# Patient Record
Sex: Female | Born: 1935 | Race: White | Hispanic: No | Marital: Married | Smoking: Never smoker
Health system: Southern US, Community
[De-identification: ages and names within clinical notes are randomized; demographics above are authoritative.]

## PROBLEM LIST (undated history)

## (undated) DIAGNOSIS — M545 Low back pain, unspecified: Secondary | ICD-10-CM

## (undated) DIAGNOSIS — G8929 Other chronic pain: Secondary | ICD-10-CM

## (undated) DIAGNOSIS — I10 Essential (primary) hypertension: Secondary | ICD-10-CM

## (undated) DIAGNOSIS — E559 Vitamin D deficiency, unspecified: Secondary | ICD-10-CM

## (undated) DIAGNOSIS — H9190 Unspecified hearing loss, unspecified ear: Secondary | ICD-10-CM

## (undated) HISTORY — PX: APPENDECTOMY: SHX54

## (undated) HISTORY — PX: CHOLECYSTECTOMY: SHX55

## (undated) HISTORY — PX: ABDOMINAL HYSTERECTOMY: SHX81

## (undated) HISTORY — DX: Essential (primary) hypertension: I10

---

## 1999-09-15 ENCOUNTER — Other Ambulatory Visit: Admission: RE | Admit: 1999-09-15 | Discharge: 1999-09-15 | Payer: Self-pay | Admitting: Emergency Medicine

## 1999-11-09 ENCOUNTER — Ambulatory Visit (HOSPITAL_COMMUNITY): Admission: RE | Admit: 1999-11-09 | Discharge: 1999-11-09 | Payer: Self-pay | Admitting: *Deleted

## 2000-04-08 ENCOUNTER — Encounter: Admission: RE | Admit: 2000-04-08 | Discharge: 2000-04-08 | Payer: Self-pay | Admitting: Emergency Medicine

## 2000-04-08 ENCOUNTER — Encounter: Payer: Self-pay | Admitting: Emergency Medicine

## 2000-09-17 ENCOUNTER — Other Ambulatory Visit: Admission: RE | Admit: 2000-09-17 | Discharge: 2000-09-17 | Payer: Self-pay | Admitting: Emergency Medicine

## 2001-04-11 ENCOUNTER — Encounter: Admission: RE | Admit: 2001-04-11 | Discharge: 2001-04-11 | Payer: Self-pay | Admitting: Emergency Medicine

## 2001-04-11 ENCOUNTER — Encounter: Payer: Self-pay | Admitting: Emergency Medicine

## 2002-04-13 ENCOUNTER — Encounter: Payer: Self-pay | Admitting: Emergency Medicine

## 2002-04-13 ENCOUNTER — Encounter: Admission: RE | Admit: 2002-04-13 | Discharge: 2002-04-13 | Payer: Self-pay | Admitting: Emergency Medicine

## 2003-04-16 ENCOUNTER — Encounter: Admission: RE | Admit: 2003-04-16 | Discharge: 2003-04-16 | Payer: Self-pay | Admitting: Emergency Medicine

## 2003-04-16 ENCOUNTER — Encounter: Payer: Self-pay | Admitting: Emergency Medicine

## 2004-05-18 ENCOUNTER — Ambulatory Visit (HOSPITAL_COMMUNITY): Admission: RE | Admit: 2004-05-18 | Discharge: 2004-05-18 | Payer: Self-pay | Admitting: Emergency Medicine

## 2005-01-11 ENCOUNTER — Encounter (INDEPENDENT_AMBULATORY_CARE_PROVIDER_SITE_OTHER): Payer: Self-pay | Admitting: *Deleted

## 2005-01-11 ENCOUNTER — Ambulatory Visit (HOSPITAL_COMMUNITY): Admission: RE | Admit: 2005-01-11 | Discharge: 2005-01-11 | Payer: Self-pay | Admitting: *Deleted

## 2005-07-02 ENCOUNTER — Ambulatory Visit (HOSPITAL_COMMUNITY): Admission: RE | Admit: 2005-07-02 | Discharge: 2005-07-02 | Payer: Self-pay | Admitting: Emergency Medicine

## 2006-07-03 ENCOUNTER — Ambulatory Visit (HOSPITAL_COMMUNITY): Admission: RE | Admit: 2006-07-03 | Discharge: 2006-07-03 | Payer: Self-pay | Admitting: Emergency Medicine

## 2007-07-07 ENCOUNTER — Ambulatory Visit (HOSPITAL_COMMUNITY): Admission: RE | Admit: 2007-07-07 | Discharge: 2007-07-07 | Payer: Self-pay | Admitting: Emergency Medicine

## 2008-07-07 ENCOUNTER — Ambulatory Visit (HOSPITAL_COMMUNITY): Admission: RE | Admit: 2008-07-07 | Discharge: 2008-07-07 | Payer: Self-pay | Admitting: Emergency Medicine

## 2009-07-13 ENCOUNTER — Ambulatory Visit (HOSPITAL_COMMUNITY): Admission: RE | Admit: 2009-07-13 | Discharge: 2009-07-13 | Payer: Self-pay | Admitting: Emergency Medicine

## 2010-07-17 ENCOUNTER — Ambulatory Visit (HOSPITAL_COMMUNITY): Admission: RE | Admit: 2010-07-17 | Discharge: 2010-07-17 | Payer: Self-pay | Admitting: Family Medicine

## 2011-07-02 ENCOUNTER — Other Ambulatory Visit (HOSPITAL_COMMUNITY): Payer: Self-pay | Admitting: Family Medicine

## 2011-07-02 DIAGNOSIS — Z1231 Encounter for screening mammogram for malignant neoplasm of breast: Secondary | ICD-10-CM

## 2011-07-19 ENCOUNTER — Ambulatory Visit (HOSPITAL_COMMUNITY)
Admission: RE | Admit: 2011-07-19 | Discharge: 2011-07-19 | Disposition: A | Discharge status: Home / Self Care | Payer: Medicare Other | Source: Ambulatory Visit | Attending: Family Medicine | Admitting: Family Medicine

## 2011-07-19 DIAGNOSIS — Z1231 Encounter for screening mammogram for malignant neoplasm of breast: Secondary | ICD-10-CM | POA: Insufficient documentation

## 2011-10-10 DIAGNOSIS — H26499 Other secondary cataract, unspecified eye: Secondary | ICD-10-CM | POA: Diagnosis not present

## 2011-10-10 DIAGNOSIS — H353190 Nonexudative age-related macular degeneration, unspecified eye, stage unspecified: Secondary | ICD-10-CM | POA: Diagnosis not present

## 2011-10-10 DIAGNOSIS — H35349 Macular cyst, hole, or pseudohole, unspecified eye: Secondary | ICD-10-CM | POA: Diagnosis not present

## 2011-10-10 DIAGNOSIS — H43819 Vitreous degeneration, unspecified eye: Secondary | ICD-10-CM | POA: Diagnosis not present

## 2011-11-14 DIAGNOSIS — H43829 Vitreomacular adhesion, unspecified eye: Secondary | ICD-10-CM | POA: Diagnosis not present

## 2011-11-14 DIAGNOSIS — H353190 Nonexudative age-related macular degeneration, unspecified eye, stage unspecified: Secondary | ICD-10-CM | POA: Diagnosis not present

## 2011-11-14 DIAGNOSIS — H35429 Microcystoid degeneration of retina, unspecified eye: Secondary | ICD-10-CM | POA: Diagnosis not present

## 2011-11-14 DIAGNOSIS — H43819 Vitreous degeneration, unspecified eye: Secondary | ICD-10-CM | POA: Diagnosis not present

## 2012-01-16 DIAGNOSIS — H35429 Microcystoid degeneration of retina, unspecified eye: Secondary | ICD-10-CM | POA: Diagnosis not present

## 2012-01-16 DIAGNOSIS — H3581 Retinal edema: Secondary | ICD-10-CM | POA: Diagnosis not present

## 2012-01-16 DIAGNOSIS — H353190 Nonexudative age-related macular degeneration, unspecified eye, stage unspecified: Secondary | ICD-10-CM | POA: Diagnosis not present

## 2012-01-16 DIAGNOSIS — H43829 Vitreomacular adhesion, unspecified eye: Secondary | ICD-10-CM | POA: Diagnosis not present

## 2012-02-21 DIAGNOSIS — Z1331 Encounter for screening for depression: Secondary | ICD-10-CM | POA: Diagnosis not present

## 2012-02-21 DIAGNOSIS — L219 Seborrheic dermatitis, unspecified: Secondary | ICD-10-CM | POA: Diagnosis not present

## 2012-02-21 DIAGNOSIS — M1990 Unspecified osteoarthritis, unspecified site: Secondary | ICD-10-CM | POA: Diagnosis not present

## 2012-02-21 DIAGNOSIS — D126 Benign neoplasm of colon, unspecified: Secondary | ICD-10-CM | POA: Diagnosis not present

## 2012-02-21 DIAGNOSIS — L2089 Other atopic dermatitis: Secondary | ICD-10-CM | POA: Diagnosis not present

## 2012-02-21 DIAGNOSIS — I10 Essential (primary) hypertension: Secondary | ICD-10-CM | POA: Diagnosis not present

## 2012-02-21 DIAGNOSIS — J301 Allergic rhinitis due to pollen: Secondary | ICD-10-CM | POA: Diagnosis not present

## 2012-02-21 DIAGNOSIS — K5730 Diverticulosis of large intestine without perforation or abscess without bleeding: Secondary | ICD-10-CM | POA: Diagnosis not present

## 2012-04-16 DIAGNOSIS — H353190 Nonexudative age-related macular degeneration, unspecified eye, stage unspecified: Secondary | ICD-10-CM | POA: Diagnosis not present

## 2012-04-16 DIAGNOSIS — H3581 Retinal edema: Secondary | ICD-10-CM | POA: Diagnosis not present

## 2012-04-16 DIAGNOSIS — H35429 Microcystoid degeneration of retina, unspecified eye: Secondary | ICD-10-CM | POA: Diagnosis not present

## 2012-04-16 DIAGNOSIS — H43829 Vitreomacular adhesion, unspecified eye: Secondary | ICD-10-CM | POA: Diagnosis not present

## 2012-06-16 ENCOUNTER — Other Ambulatory Visit (HOSPITAL_COMMUNITY): Payer: Self-pay | Admitting: Family Medicine

## 2012-06-16 DIAGNOSIS — Z1231 Encounter for screening mammogram for malignant neoplasm of breast: Secondary | ICD-10-CM

## 2012-07-21 ENCOUNTER — Ambulatory Visit (HOSPITAL_COMMUNITY)
Admission: RE | Admit: 2012-07-21 | Discharge: 2012-07-21 | Disposition: A | Discharge status: Home / Self Care | Payer: Medicare Other | Source: Ambulatory Visit | Attending: Family Medicine | Admitting: Family Medicine

## 2012-07-21 DIAGNOSIS — Z1231 Encounter for screening mammogram for malignant neoplasm of breast: Secondary | ICD-10-CM | POA: Diagnosis not present

## 2012-08-13 DIAGNOSIS — H353190 Nonexudative age-related macular degeneration, unspecified eye, stage unspecified: Secondary | ICD-10-CM | POA: Diagnosis not present

## 2012-08-13 DIAGNOSIS — H3581 Retinal edema: Secondary | ICD-10-CM | POA: Diagnosis not present

## 2012-08-13 DIAGNOSIS — H43829 Vitreomacular adhesion, unspecified eye: Secondary | ICD-10-CM | POA: Diagnosis not present

## 2012-08-13 DIAGNOSIS — H35429 Microcystoid degeneration of retina, unspecified eye: Secondary | ICD-10-CM | POA: Diagnosis not present

## 2012-08-28 DIAGNOSIS — Z0000 Encounter for general adult medical examination without abnormal findings: Secondary | ICD-10-CM | POA: Diagnosis not present

## 2012-08-28 DIAGNOSIS — E785 Hyperlipidemia, unspecified: Secondary | ICD-10-CM | POA: Diagnosis not present

## 2012-08-28 DIAGNOSIS — E559 Vitamin D deficiency, unspecified: Secondary | ICD-10-CM | POA: Diagnosis not present

## 2012-08-28 DIAGNOSIS — I10 Essential (primary) hypertension: Secondary | ICD-10-CM | POA: Diagnosis not present

## 2012-08-28 DIAGNOSIS — Z23 Encounter for immunization: Secondary | ICD-10-CM | POA: Diagnosis not present

## 2012-08-28 DIAGNOSIS — Z79899 Other long term (current) drug therapy: Secondary | ICD-10-CM | POA: Diagnosis not present

## 2012-11-13 DIAGNOSIS — H353190 Nonexudative age-related macular degeneration, unspecified eye, stage unspecified: Secondary | ICD-10-CM | POA: Diagnosis not present

## 2012-11-13 DIAGNOSIS — H04209 Unspecified epiphora, unspecified lacrimal gland: Secondary | ICD-10-CM | POA: Diagnosis not present

## 2012-11-13 DIAGNOSIS — H43399 Other vitreous opacities, unspecified eye: Secondary | ICD-10-CM | POA: Diagnosis not present

## 2012-11-13 DIAGNOSIS — H35369 Drusen (degenerative) of macula, unspecified eye: Secondary | ICD-10-CM | POA: Diagnosis not present

## 2013-02-11 DIAGNOSIS — H43829 Vitreomacular adhesion, unspecified eye: Secondary | ICD-10-CM | POA: Diagnosis not present

## 2013-02-11 DIAGNOSIS — H353190 Nonexudative age-related macular degeneration, unspecified eye, stage unspecified: Secondary | ICD-10-CM | POA: Diagnosis not present

## 2013-02-11 DIAGNOSIS — H3581 Retinal edema: Secondary | ICD-10-CM | POA: Diagnosis not present

## 2013-02-11 DIAGNOSIS — H35429 Microcystoid degeneration of retina, unspecified eye: Secondary | ICD-10-CM | POA: Diagnosis not present

## 2013-02-26 DIAGNOSIS — I10 Essential (primary) hypertension: Secondary | ICD-10-CM | POA: Diagnosis not present

## 2013-02-26 DIAGNOSIS — H7200 Central perforation of tympanic membrane, unspecified ear: Secondary | ICD-10-CM | POA: Diagnosis not present

## 2013-02-26 DIAGNOSIS — J309 Allergic rhinitis, unspecified: Secondary | ICD-10-CM | POA: Diagnosis not present

## 2013-06-17 DIAGNOSIS — H353190 Nonexudative age-related macular degeneration, unspecified eye, stage unspecified: Secondary | ICD-10-CM | POA: Diagnosis not present

## 2013-06-17 DIAGNOSIS — H43829 Vitreomacular adhesion, unspecified eye: Secondary | ICD-10-CM | POA: Diagnosis not present

## 2013-06-17 DIAGNOSIS — H43819 Vitreous degeneration, unspecified eye: Secondary | ICD-10-CM | POA: Diagnosis not present

## 2013-09-16 DIAGNOSIS — H353190 Nonexudative age-related macular degeneration, unspecified eye, stage unspecified: Secondary | ICD-10-CM | POA: Diagnosis not present

## 2013-09-16 DIAGNOSIS — H43829 Vitreomacular adhesion, unspecified eye: Secondary | ICD-10-CM | POA: Diagnosis not present

## 2013-09-18 DIAGNOSIS — E559 Vitamin D deficiency, unspecified: Secondary | ICD-10-CM | POA: Diagnosis not present

## 2013-09-18 DIAGNOSIS — K5730 Diverticulosis of large intestine without perforation or abscess without bleeding: Secondary | ICD-10-CM | POA: Diagnosis not present

## 2013-09-18 DIAGNOSIS — H9190 Unspecified hearing loss, unspecified ear: Secondary | ICD-10-CM | POA: Diagnosis not present

## 2013-09-18 DIAGNOSIS — L219 Seborrheic dermatitis, unspecified: Secondary | ICD-10-CM | POA: Diagnosis not present

## 2013-09-18 DIAGNOSIS — M1990 Unspecified osteoarthritis, unspecified site: Secondary | ICD-10-CM | POA: Diagnosis not present

## 2013-09-18 DIAGNOSIS — D649 Anemia, unspecified: Secondary | ICD-10-CM | POA: Diagnosis not present

## 2013-09-18 DIAGNOSIS — Z0000 Encounter for general adult medical examination without abnormal findings: Secondary | ICD-10-CM | POA: Diagnosis not present

## 2013-09-18 DIAGNOSIS — Z23 Encounter for immunization: Secondary | ICD-10-CM | POA: Diagnosis not present

## 2013-09-18 DIAGNOSIS — D126 Benign neoplasm of colon, unspecified: Secondary | ICD-10-CM | POA: Diagnosis not present

## 2013-09-18 DIAGNOSIS — I10 Essential (primary) hypertension: Secondary | ICD-10-CM | POA: Diagnosis not present

## 2014-01-11 DIAGNOSIS — H43829 Vitreomacular adhesion, unspecified eye: Secondary | ICD-10-CM | POA: Diagnosis not present

## 2014-04-02 DIAGNOSIS — H7200 Central perforation of tympanic membrane, unspecified ear: Secondary | ICD-10-CM | POA: Diagnosis not present

## 2014-04-02 DIAGNOSIS — L2089 Other atopic dermatitis: Secondary | ICD-10-CM | POA: Diagnosis not present

## 2014-04-02 DIAGNOSIS — I10 Essential (primary) hypertension: Secondary | ICD-10-CM | POA: Diagnosis not present

## 2014-05-26 DIAGNOSIS — H3581 Retinal edema: Secondary | ICD-10-CM | POA: Diagnosis not present

## 2014-05-26 DIAGNOSIS — H353190 Nonexudative age-related macular degeneration, unspecified eye, stage unspecified: Secondary | ICD-10-CM | POA: Diagnosis not present

## 2014-05-26 DIAGNOSIS — H35429 Microcystoid degeneration of retina, unspecified eye: Secondary | ICD-10-CM | POA: Diagnosis not present

## 2014-05-26 DIAGNOSIS — H43829 Vitreomacular adhesion, unspecified eye: Secondary | ICD-10-CM | POA: Diagnosis not present

## 2014-07-08 DIAGNOSIS — Z23 Encounter for immunization: Secondary | ICD-10-CM | POA: Diagnosis not present

## 2014-09-23 DIAGNOSIS — Z79899 Other long term (current) drug therapy: Secondary | ICD-10-CM | POA: Diagnosis not present

## 2014-09-23 DIAGNOSIS — H7203 Central perforation of tympanic membrane, bilateral: Secondary | ICD-10-CM | POA: Diagnosis not present

## 2014-09-23 DIAGNOSIS — I10 Essential (primary) hypertension: Secondary | ICD-10-CM | POA: Diagnosis not present

## 2014-09-23 DIAGNOSIS — E785 Hyperlipidemia, unspecified: Secondary | ICD-10-CM | POA: Diagnosis not present

## 2014-09-23 DIAGNOSIS — Z23 Encounter for immunization: Secondary | ICD-10-CM | POA: Diagnosis not present

## 2014-09-23 DIAGNOSIS — J069 Acute upper respiratory infection, unspecified: Secondary | ICD-10-CM | POA: Diagnosis not present

## 2014-09-23 DIAGNOSIS — H9193 Unspecified hearing loss, bilateral: Secondary | ICD-10-CM | POA: Diagnosis not present

## 2014-09-23 DIAGNOSIS — Z0000 Encounter for general adult medical examination without abnormal findings: Secondary | ICD-10-CM | POA: Diagnosis not present

## 2014-09-23 DIAGNOSIS — E559 Vitamin D deficiency, unspecified: Secondary | ICD-10-CM | POA: Diagnosis not present

## 2014-10-11 DIAGNOSIS — D649 Anemia, unspecified: Secondary | ICD-10-CM | POA: Diagnosis not present

## 2014-11-26 ENCOUNTER — Telehealth: Payer: Self-pay | Admitting: Oncology

## 2014-11-26 NOTE — Telephone Encounter (Signed)
pt confirmed appt on 12/29/14 at 1:30 w/Shadad Dx: Anemia; elevated wbc count Referring Dr.Wolters

## 2014-11-29 ENCOUNTER — Telehealth: Payer: Self-pay | Admitting: Oncology

## 2014-11-29 NOTE — Telephone Encounter (Signed)
Chart Delivered 11/29/14 TG.

## 2014-12-29 ENCOUNTER — Encounter: Payer: Self-pay | Admitting: Oncology

## 2014-12-29 ENCOUNTER — Other Ambulatory Visit: Payer: Medicare Other

## 2014-12-29 ENCOUNTER — Telehealth: Payer: Self-pay | Admitting: Oncology

## 2014-12-29 ENCOUNTER — Ambulatory Visit: Payer: Medicare Other

## 2014-12-29 ENCOUNTER — Ambulatory Visit (HOSPITAL_BASED_OUTPATIENT_CLINIC_OR_DEPARTMENT_OTHER): Payer: Medicare Other | Admitting: Oncology

## 2014-12-29 VITALS — BP 138/53 | HR 72 | Temp 97.8°F | Resp 17 | Wt 163.7 lb

## 2014-12-29 DIAGNOSIS — D7589 Other specified diseases of blood and blood-forming organs: Secondary | ICD-10-CM

## 2014-12-29 DIAGNOSIS — D473 Essential (hemorrhagic) thrombocythemia: Secondary | ICD-10-CM | POA: Diagnosis not present

## 2014-12-29 DIAGNOSIS — D72829 Elevated white blood cell count, unspecified: Secondary | ICD-10-CM | POA: Diagnosis not present

## 2014-12-29 DIAGNOSIS — D649 Anemia, unspecified: Secondary | ICD-10-CM

## 2014-12-29 DIAGNOSIS — D500 Iron deficiency anemia secondary to blood loss (chronic): Secondary | ICD-10-CM

## 2014-12-29 DIAGNOSIS — E538 Deficiency of other specified B group vitamins: Secondary | ICD-10-CM

## 2014-12-29 NOTE — Consult Note (Signed)
Reason for Referral: Anemia and leukocytosis.   HPI: 81 year old woman who is in relatively good health with a history of hypertension and vitamin D deficiency. She continues any to be relatively asymptomatic except for chronic low back pain. She was noted to have mild anemia on her recent CBC done on January 2016. Her hemoglobin at that time was 11.2 with a white cell count of 10.3 and platelet count is slightly elevated at 432. Her RDW is elevated at 16.1. The differential of her white cell count was completely normal. She had a serum protein electrophoresis at that time which showed no M spike detected. She also had a both elevated free kappa and free lambda light chains. Slightly elevated ratio of 2.33. Her CBC back in December 2015 showed a hemoglobin of 11.6 white cell count of 13.4 with a platelet count 471. Her chemistries showed a creatinine of 1.32 with normal liver function tests normal protein and chemistries. Her TSH, vitamin D and cholesterol are within normal range. Completely asymptomatic from this at this time. She has not reported any hematochezia or melena. Did not report any hematuria or dysuria. She is up-to-date on her mammography as well as had a colonoscopy in the past.  She does not report any headaches blurry vision double vision or syncope. She does not report any fevers, chills, weight loss or appetite changes. She reports reports weight gain. She does not report any chest pain, palpitation, orthopnea or leg edema. He does not report any nausea, vomiting, abdominal pain, hematochezia, melena. She does not report any frequency urgency or hesitancy. She does report chronic back pain which is unchanged. She does not report any lymphadenopathy or petechiae. The remaining review of systems unremarkable.   Past Medical History  Diagnosis Date  . Hypertension   :   Current outpatient prescriptions:  .  aspirin 81 MG tablet, Take 81 mg by mouth daily., Disp: , Rfl:  .   lisinopril-hydrochlorothiazide (PRINZIDE,ZESTORETIC) 20-25 MG per tablet, Take 1 tablet by mouth daily., Disp: , Rfl:  .  Multiple Vitamin (MULTIVITAMIN) tablet, Take 1 tablet by mouth daily., Disp: , Rfl:  .  Omega-3 Fatty Acids (FISH OIL) 1000 MG CAPS, Take 1 capsule by mouth daily., Disp: , Rfl: :  Not on File:  No family history on file.:  History   Social History  . Marital Status: Married    Spouse Name: N/A  . Number of Children: N/A  . Years of Education: N/A   Occupational History  . Not on file.   Social History Main Topics  . Smoking status: Not on file  . Smokeless tobacco: Not on file  . Alcohol Use: Not on file  . Drug Use: Not on file  . Sexual Activity: Not on file   Other Topics Concern  . Not on file   Social History Narrative  . No narrative on file  :  Pertinent items are noted in HPI.  Exam: ECOG 1 Blood pressure 138/53, pulse 72, temperature 97.8 F (36.6 C), temperature source Oral, resp. rate 17, weight 163 lb 11.2 oz (74.254 kg), SpO2 96 %. General appearance: alert and cooperative Head: Normocephalic, without obvious abnormality Throat: lips, mucosa, and tongue normal; teeth and gums normal Neck: no adenopathy Back: negative Resp: clear to auscultation bilaterally Chest wall: no tenderness Cardio: regular rate and rhythm, S1, S2 normal, no murmur, click, rub or gallop GI: soft, non-tender; bowel sounds normal; no masses,  no organomegaly Extremities: extremities normal, atraumatic, no cyanosis or  edema Pulses: 2+ and symmetric Skin: Skin color, texture, turgor normal. No rashes or lesions Lymph nodes: Cervical, supraclavicular, and axillary nodes normal.     Assessment and Plan:   81 year old woman with the following issues:  1. Mild anemia with a hemoglobin ranging between 11.2 and 11.6. Her MCV is slightly elevated at 99.1. Differential diagnosis was discussed with the patient and her daughter. This could be related to anemia of  chronic disease, anemia of renal insufficiency with a creatinine clearance of 38 mL/m based on her chemistries in December 2015. This could also be related to vitamin I-71, folic acid deficiency. Iron deficiency would also be a consideration although this should cause microcytosis rather than macrocytosis. Early myelodysplasia could also be a consideration. For the time being, she is completely asymptomatic with a very mild hemoglobin I see no need for any intervention at this point but I will check her iron studies as well as vitamin S-92 and folic acid with the next visit.  2. Leukocytosis and thrombocytosis: This appears to be reactive in nature. Her platelet counts actually close to normal in January 2016. She did have an elevated RDW which could be consistent with iron deficiency anemia which will be evaluating as well. Overall I see these findings to be rather mild likely reactive and she is completely asymptomatic at this time. I have recommended continued observation for the time being and we'll defer further testing such as a bone marrow biopsy if these findings get worse.

## 2014-12-29 NOTE — Telephone Encounter (Signed)
Pt confirmed labs/ov per 03/23 POF, gave pt AVS and calendar..... KJ  °

## 2014-12-29 NOTE — Progress Notes (Signed)
Please see consult note.  

## 2014-12-29 NOTE — Progress Notes (Signed)
Checked in new pt with no financial concerns at this time. ° °

## 2014-12-30 DIAGNOSIS — H905 Unspecified sensorineural hearing loss: Secondary | ICD-10-CM | POA: Diagnosis not present

## 2014-12-30 DIAGNOSIS — H9113 Presbycusis, bilateral: Secondary | ICD-10-CM | POA: Diagnosis not present

## 2014-12-30 DIAGNOSIS — H7291 Unspecified perforation of tympanic membrane, right ear: Secondary | ICD-10-CM | POA: Diagnosis not present

## 2015-03-30 ENCOUNTER — Other Ambulatory Visit (HOSPITAL_BASED_OUTPATIENT_CLINIC_OR_DEPARTMENT_OTHER): Payer: Medicare Other

## 2015-03-30 ENCOUNTER — Ambulatory Visit (HOSPITAL_BASED_OUTPATIENT_CLINIC_OR_DEPARTMENT_OTHER): Payer: Medicare Other | Admitting: Oncology

## 2015-03-30 VITALS — BP 122/56 | HR 81 | Temp 98.1°F | Resp 18 | Wt 164.2 lb

## 2015-03-30 DIAGNOSIS — D7589 Other specified diseases of blood and blood-forming organs: Secondary | ICD-10-CM

## 2015-03-30 DIAGNOSIS — D649 Anemia, unspecified: Secondary | ICD-10-CM

## 2015-03-30 DIAGNOSIS — E538 Deficiency of other specified B group vitamins: Secondary | ICD-10-CM

## 2015-03-30 DIAGNOSIS — D631 Anemia in chronic kidney disease: Secondary | ICD-10-CM

## 2015-03-30 DIAGNOSIS — N189 Chronic kidney disease, unspecified: Principal | ICD-10-CM

## 2015-03-30 DIAGNOSIS — D500 Iron deficiency anemia secondary to blood loss (chronic): Secondary | ICD-10-CM | POA: Diagnosis not present

## 2015-03-30 DIAGNOSIS — D72829 Elevated white blood cell count, unspecified: Secondary | ICD-10-CM

## 2015-03-30 LAB — COMPREHENSIVE METABOLIC PANEL (CC13)
ALBUMIN: 3.4 g/dL — AB (ref 3.5–5.0)
ALK PHOS: 125 U/L (ref 40–150)
ALT: 12 U/L (ref 0–55)
AST: 16 U/L (ref 5–34)
Anion Gap: 12 mEq/L — ABNORMAL HIGH (ref 3–11)
BUN: 19.1 mg/dL (ref 7.0–26.0)
CO2: 30 mEq/L — ABNORMAL HIGH (ref 22–29)
Calcium: 9.2 mg/dL (ref 8.4–10.4)
Chloride: 97 mEq/L — ABNORMAL LOW (ref 98–109)
Creatinine: 1.2 mg/dL — ABNORMAL HIGH (ref 0.6–1.1)
EGFR: 40 mL/min/{1.73_m2} — AB (ref >90)
Glucose: 100 mg/dl (ref 70–140)
POTASSIUM: 3.8 meq/L (ref 3.5–5.1)
SODIUM: 139 meq/L (ref 136–145)
TOTAL PROTEIN: 7.2 g/dL (ref 6.4–8.3)
Total Bilirubin: 0.25 mg/dL (ref 0.20–1.20)

## 2015-03-30 LAB — CBC WITH DIFFERENTIAL/PLATELET
BASO%: 0.2 % (ref 0.0–2.0)
Basophils Absolute: 0 10*3/uL (ref 0.0–0.1)
EOS%: 3 % (ref 0.0–7.0)
Eosinophils Absolute: 0.4 10*3/uL (ref 0.0–0.5)
HEMATOCRIT: 32 % — AB (ref 34.8–46.6)
HGB: 10.5 g/dL — ABNORMAL LOW (ref 11.6–15.9)
LYMPH%: 20 % (ref 14.0–49.7)
MCH: 32 pg (ref 25.1–34.0)
MCHC: 32.8 g/dL (ref 31.5–36.0)
MCV: 97.6 fL (ref 79.5–101.0)
MONO#: 1.1 10*3/uL — AB (ref 0.1–0.9)
MONO%: 8.3 % (ref 0.0–14.0)
NEUT#: 8.8 10*3/uL — ABNORMAL HIGH (ref 1.5–6.5)
NEUT%: 68.5 % (ref 38.4–76.8)
PLATELETS: 400 10*3/uL (ref 145–400)
RBC: 3.28 10*6/uL — ABNORMAL LOW (ref 3.70–5.45)
RDW: 16.1 % — ABNORMAL HIGH (ref 11.2–14.5)
WBC: 12.9 10*3/uL — AB (ref 3.9–10.3)
lymph#: 2.6 10*3/uL (ref 0.9–3.3)

## 2015-03-30 LAB — IRON AND TIBC CHCC
%SAT: 13 % — ABNORMAL LOW (ref 21–57)
Iron: 59 ug/dL (ref 41–142)
TIBC: 439 ug/dL (ref 236–444)
UIBC: 380 ug/dL (ref 120–384)

## 2015-03-30 LAB — FOLATE: FOLATE: 15.8 ng/mL

## 2015-03-30 LAB — VITAMIN B12: Vitamin B-12: 494 pg/mL (ref 211–911)

## 2015-03-30 NOTE — Progress Notes (Signed)
Hematology and Oncology Follow Up Visit  Tracey Marks [id number] 10/15/1927 81 y.o. 03/30/2015 10:27 AM Lilian Coma, MDWolters, Ivin Booty, MD   Principle Diagnosis: 81 year old woman with mild anemia and slight leukocytosis diagnosed in March 2016. This likely is a reactive finding and possibly mild iron deficiency anemia.   Current therapy:  Observation and surveillance.   Interim History:  Mrs. Mcpherson presents today for a follow-up visit with her daughter. She is a pleasant elderly woman who is in reasonably good health but hard of hearing. Since the last visit, she is completely asymptomatic. She continues to feel well without any constitutional symptoms. Her appetite is reasonable and her quality of life is unchanged. She has not reported any lymphadenopathy or petechiae. She has not reported any falls or syncope.   She does not report any headaches blurry vision double vision or syncope. She does not report any fevers, chills, weight loss or appetite changes. She reports reports weight gain. She does not report any chest pain, palpitation, orthopnea or leg edema. He does not report any nausea, vomiting, abdominal pain, hematochezia, melena. She does not report any frequency urgency or hesitancy. She does report chronic back pain which is unchanged. She does not report any lymphadenopathy or petechiae. The remaining review of systems unremarkable.   Medications: I have reviewed the patient's current medications.  Current Outpatient Prescriptions  Medication Sig Dispense Refill  . aspirin 81 MG tablet Take 81 mg by mouth daily.    Marland Kitchen lisinopril-hydrochlorothiazide (PRINZIDE,ZESTORETIC) 20-25 MG per tablet Take 1 tablet by mouth daily.    . Multiple Vitamin (MULTIVITAMIN) tablet Take 1 tablet by mouth daily.    . Omega-3 Fatty Acids (FISH OIL) 1000 MG CAPS Take 1 capsule by mouth daily.     No current facility-administered medications for this visit.     Allergies: Not on File  Past Medical  History, Surgical history, Social history, and Family History were reviewed and updated.   Physical Exam: Blood pressure 122/56, pulse 81, temperature 98.1 F (36.7 C), temperature source Oral, resp. rate 18, weight 164 lb 3.2 oz (74.481 kg). ECOG: 0 General appearance: alert and cooperative Head: Normocephalic, without obvious abnormality Neck: no adenopathy Lymph nodes: Cervical, supraclavicular, and axillary nodes normal. Heart:regular rate and rhythm, S1, S2 normal, no murmur, click, rub or gallop Lung: Clear without any rhonchi, wheezes or dullness to percussion. Abdomin: soft, non-tender, without masses or organomegaly EXT:no erythema, induration, or nodules   Lab Results: Lab Results  Component Value Date   WBC 12.9* 03/30/2015   HGB 10.5* 03/30/2015   HCT 32.0* 03/30/2015   MCV 97.6 03/30/2015   PLT 400 03/30/2015     Chemistry      Component Value Date/Time   NA 139 03/30/2015 0942   K 3.8 03/30/2015 0942   CO2 30* 03/30/2015 0942   BUN 19.1 03/30/2015 0942   CREATININE 1.2* 03/30/2015 0942      Component Value Date/Time   CALCIUM 9.2 03/30/2015 0942   ALKPHOS 125 03/30/2015 0942   AST 16 03/30/2015 0942   ALT 12 03/30/2015 0942   BILITOT 0.25 03/30/2015 0942        Impression and Plan:  81 year old woman with the following issues:  1. Mild anemia with a hemoglobin ranging between 10 and 11. Her MCV is normal. Differential diagnosis was discussed with the patient and her daughter again today. She could've an element of iron deficiency, chronic disease, renal disease versus early myelodysplasia.  I have rechecked your iron  studies and V85 and folic acid levels. We will supplement disease if needed to.  I recommended continuing with monitoring her counts on an intermittent basis and patient's first of that done on an annual basis with her primary care physician. She feels a relatively well and would like to cut down on her visits with healthcare  providers.  I see no objections at this time and I'll be happy to see her in the future as needed. I educated her and her daughter about signs or symptoms of hematological disorder including constitutional symptoms, petechiae, bleeding among others.  2. Leukocytosis: Very mild and likely reactive in nature without any sign of a blood disorder.   [facility], MD 6/22/201610:27 AM

## 2015-08-30 DIAGNOSIS — H9071 Mixed conductive and sensorineural hearing loss, unilateral, right ear, with unrestricted hearing on the contralateral side: Secondary | ICD-10-CM | POA: Diagnosis not present

## 2015-08-30 DIAGNOSIS — H9042 Sensorineural hearing loss, unilateral, left ear, with unrestricted hearing on the contralateral side: Secondary | ICD-10-CM | POA: Diagnosis not present

## 2015-09-28 DIAGNOSIS — Z23 Encounter for immunization: Secondary | ICD-10-CM | POA: Diagnosis not present

## 2015-09-28 DIAGNOSIS — M1990 Unspecified osteoarthritis, unspecified site: Secondary | ICD-10-CM | POA: Diagnosis not present

## 2015-09-28 DIAGNOSIS — Z0000 Encounter for general adult medical examination without abnormal findings: Secondary | ICD-10-CM | POA: Diagnosis not present

## 2015-09-28 DIAGNOSIS — E559 Vitamin D deficiency, unspecified: Secondary | ICD-10-CM | POA: Diagnosis not present

## 2015-09-28 DIAGNOSIS — E785 Hyperlipidemia, unspecified: Secondary | ICD-10-CM | POA: Diagnosis not present

## 2015-09-28 DIAGNOSIS — D126 Benign neoplasm of colon, unspecified: Secondary | ICD-10-CM | POA: Diagnosis not present

## 2015-09-28 DIAGNOSIS — L2084 Intrinsic (allergic) eczema: Secondary | ICD-10-CM | POA: Diagnosis not present

## 2015-09-28 DIAGNOSIS — Z79899 Other long term (current) drug therapy: Secondary | ICD-10-CM | POA: Diagnosis not present

## 2015-09-28 DIAGNOSIS — D649 Anemia, unspecified: Secondary | ICD-10-CM | POA: Diagnosis not present

## 2015-09-28 DIAGNOSIS — L219 Seborrheic dermatitis, unspecified: Secondary | ICD-10-CM | POA: Diagnosis not present

## 2015-09-28 DIAGNOSIS — K5730 Diverticulosis of large intestine without perforation or abscess without bleeding: Secondary | ICD-10-CM | POA: Diagnosis not present

## 2015-09-28 DIAGNOSIS — I10 Essential (primary) hypertension: Secondary | ICD-10-CM | POA: Diagnosis not present

## 2015-09-30 ENCOUNTER — Encounter: Payer: Self-pay | Admitting: *Deleted

## 2015-10-12 DIAGNOSIS — H02054 Trichiasis without entropian left upper eyelid: Secondary | ICD-10-CM | POA: Diagnosis not present

## 2015-10-12 DIAGNOSIS — H353120 Nonexudative age-related macular degeneration, left eye, stage unspecified: Secondary | ICD-10-CM | POA: Diagnosis not present

## 2015-10-12 DIAGNOSIS — H353110 Nonexudative age-related macular degeneration, right eye, stage unspecified: Secondary | ICD-10-CM | POA: Diagnosis not present

## 2015-10-12 DIAGNOSIS — Z961 Presence of intraocular lens: Secondary | ICD-10-CM | POA: Diagnosis not present

## 2015-10-12 DIAGNOSIS — Z8679 Personal history of other diseases of the circulatory system: Secondary | ICD-10-CM | POA: Diagnosis not present

## 2015-10-12 DIAGNOSIS — H35371 Puckering of macula, right eye: Secondary | ICD-10-CM | POA: Diagnosis not present

## 2015-10-12 DIAGNOSIS — H02056 Trichiasis without entropian left eye, unspecified eyelid: Secondary | ICD-10-CM | POA: Diagnosis not present

## 2015-10-12 DIAGNOSIS — H43821 Vitreomacular adhesion, right eye: Secondary | ICD-10-CM | POA: Diagnosis not present

## 2016-01-01 ENCOUNTER — Observation Stay (HOSPITAL_COMMUNITY)
Admission: EM | Admit: 2016-01-01 | Discharge: 2016-01-02 | Disposition: A | Discharge status: Home / Self Care | Payer: Medicare Other | Attending: Internal Medicine | Admitting: Internal Medicine

## 2016-01-01 ENCOUNTER — Emergency Department (HOSPITAL_COMMUNITY): Payer: Medicare Other

## 2016-01-01 ENCOUNTER — Encounter (HOSPITAL_COMMUNITY): Payer: Self-pay | Admitting: Emergency Medicine

## 2016-01-01 DIAGNOSIS — Z7982 Long term (current) use of aspirin: Secondary | ICD-10-CM | POA: Diagnosis not present

## 2016-01-01 DIAGNOSIS — I4510 Unspecified right bundle-branch block: Secondary | ICD-10-CM | POA: Diagnosis not present

## 2016-01-01 DIAGNOSIS — W19XXXA Unspecified fall, initial encounter: Secondary | ICD-10-CM

## 2016-01-01 DIAGNOSIS — D72829 Elevated white blood cell count, unspecified: Secondary | ICD-10-CM | POA: Diagnosis not present

## 2016-01-01 DIAGNOSIS — M545 Low back pain: Secondary | ICD-10-CM | POA: Insufficient documentation

## 2016-01-01 DIAGNOSIS — Z79899 Other long term (current) drug therapy: Secondary | ICD-10-CM | POA: Diagnosis not present

## 2016-01-01 DIAGNOSIS — T148 Other injury of unspecified body region: Secondary | ICD-10-CM | POA: Diagnosis not present

## 2016-01-01 DIAGNOSIS — H9190 Unspecified hearing loss, unspecified ear: Secondary | ICD-10-CM | POA: Insufficient documentation

## 2016-01-01 DIAGNOSIS — I10 Essential (primary) hypertension: Secondary | ICD-10-CM | POA: Diagnosis not present

## 2016-01-01 DIAGNOSIS — S79912A Unspecified injury of left hip, initial encounter: Secondary | ICD-10-CM | POA: Diagnosis not present

## 2016-01-01 DIAGNOSIS — R531 Weakness: Secondary | ICD-10-CM | POA: Diagnosis not present

## 2016-01-01 DIAGNOSIS — M546 Pain in thoracic spine: Secondary | ICD-10-CM | POA: Diagnosis not present

## 2016-01-01 DIAGNOSIS — M25552 Pain in left hip: Secondary | ICD-10-CM | POA: Insufficient documentation

## 2016-01-01 DIAGNOSIS — S299XXA Unspecified injury of thorax, initial encounter: Secondary | ICD-10-CM | POA: Diagnosis not present

## 2016-01-01 DIAGNOSIS — S39012A Strain of muscle, fascia and tendon of lower back, initial encounter: Secondary | ICD-10-CM | POA: Diagnosis not present

## 2016-01-01 DIAGNOSIS — R55 Syncope and collapse: Principal | ICD-10-CM | POA: Insufficient documentation

## 2016-01-01 DIAGNOSIS — S3992XA Unspecified injury of lower back, initial encounter: Secondary | ICD-10-CM | POA: Diagnosis not present

## 2016-01-01 DIAGNOSIS — M25559 Pain in unspecified hip: Secondary | ICD-10-CM | POA: Diagnosis not present

## 2016-01-01 DIAGNOSIS — D649 Anemia, unspecified: Secondary | ICD-10-CM | POA: Insufficient documentation

## 2016-01-01 HISTORY — DX: Vitamin D deficiency, unspecified: E55.9

## 2016-01-01 HISTORY — DX: Unspecified hearing loss, unspecified ear: H91.90

## 2016-01-01 HISTORY — DX: Other chronic pain: G89.29

## 2016-01-01 HISTORY — DX: Low back pain, unspecified: M54.50

## 2016-01-01 HISTORY — DX: Low back pain: M54.5

## 2016-01-01 LAB — URINALYSIS, ROUTINE W REFLEX MICROSCOPIC
Bilirubin Urine: NEGATIVE
GLUCOSE, UA: NEGATIVE mg/dL
HGB URINE DIPSTICK: NEGATIVE
KETONES UR: NEGATIVE mg/dL
Leukocytes, UA: NEGATIVE
Nitrite: NEGATIVE
PROTEIN: NEGATIVE mg/dL
Specific Gravity, Urine: 1.016 (ref 1.005–1.030)
pH: 7 (ref 5.0–8.0)

## 2016-01-01 LAB — CBC
HEMATOCRIT: 33.7 % — AB (ref 36.0–46.0)
HEMOGLOBIN: 11.2 g/dL — AB (ref 12.0–15.0)
MCH: 31.7 pg (ref 26.0–34.0)
MCHC: 33.2 g/dL (ref 30.0–36.0)
MCV: 95.5 fL (ref 78.0–100.0)
Platelets: 440 10*3/uL — ABNORMAL HIGH (ref 150–400)
RBC: 3.53 MIL/uL — ABNORMAL LOW (ref 3.87–5.11)
RDW: 17.3 % — ABNORMAL HIGH (ref 11.5–15.5)
WBC: 20.6 10*3/uL — ABNORMAL HIGH (ref 4.0–10.5)

## 2016-01-01 LAB — COMPREHENSIVE METABOLIC PANEL
ALBUMIN: 3.9 g/dL (ref 3.5–5.0)
ALK PHOS: 124 U/L (ref 38–126)
ALT: 14 U/L (ref 14–54)
ANION GAP: 12 (ref 5–15)
AST: 20 U/L (ref 15–41)
BILIRUBIN TOTAL: 0.5 mg/dL (ref 0.3–1.2)
BUN: 24 mg/dL — AB (ref 6–20)
CALCIUM: 9 mg/dL (ref 8.9–10.3)
CO2: 25 mmol/L (ref 22–32)
Chloride: 98 mmol/L — ABNORMAL LOW (ref 101–111)
Creatinine, Ser: 1.27 mg/dL — ABNORMAL HIGH (ref 0.44–1.00)
GFR calc Af Amer: 43 mL/min — ABNORMAL LOW (ref >60)
GFR, EST NON AFRICAN AMERICAN: 37 mL/min — AB (ref >60)
GLUCOSE: 109 mg/dL — AB (ref 65–99)
Potassium: 4.3 mmol/L (ref 3.5–5.1)
Sodium: 135 mmol/L (ref 135–145)
TOTAL PROTEIN: 7.4 g/dL (ref 6.5–8.1)

## 2016-01-01 LAB — TROPONIN I

## 2016-01-01 LAB — CK: CK TOTAL: 150 U/L (ref 38–234)

## 2016-01-01 LAB — TSH: TSH: 2.312 u[IU]/mL (ref 0.350–4.500)

## 2016-01-01 LAB — I-STAT CG4 LACTIC ACID, ED: LACTIC ACID, VENOUS: 1.37 mmol/L (ref 0.5–2.0)

## 2016-01-01 MED ORDER — ADULT MULTIVITAMIN W/MINERALS CH
1.0000 | ORAL_TABLET | Freq: Every day | ORAL | Status: DC
  Administered 2016-01-02: 1 via ORAL
  Filled 2016-01-01 (×2): qty 1

## 2016-01-01 MED ORDER — ASPIRIN EC 81 MG PO TBEC
81.0000 mg | DELAYED_RELEASE_TABLET | Freq: Every day | ORAL | Status: DC
  Administered 2016-01-02: 81 mg via ORAL
  Filled 2016-01-01: qty 1

## 2016-01-01 MED ORDER — ONDANSETRON HCL 4 MG PO TABS: 4.0000 mg | ORAL_TABLET | Freq: Four times a day (QID) | ORAL | Status: DC | PRN

## 2016-01-01 MED ORDER — SODIUM CHLORIDE 0.9% FLUSH
3.0000 mL | Freq: Two times a day (BID) | INTRAVENOUS | Status: DC
  Administered 2016-01-01 – 2016-01-02 (×2): 3 mL via INTRAVENOUS

## 2016-01-01 MED ORDER — OXYCODONE HCL 5 MG PO TABS
5.0000 mg | ORAL_TABLET | ORAL | Status: DC | PRN
  Administered 2016-01-01 – 2016-01-02 (×3): 5 mg via ORAL
  Filled 2016-01-01 (×3): qty 1

## 2016-01-01 MED ORDER — ACETAMINOPHEN 325 MG PO TABS: 650.0000 mg | ORAL_TABLET | Freq: Four times a day (QID) | ORAL | Status: DC | PRN

## 2016-01-01 MED ORDER — ACETAMINOPHEN 500 MG PO TABS
1000.0000 mg | ORAL_TABLET | Freq: Once | ORAL | Status: AC
  Administered 2016-01-01: 1000 mg via ORAL
  Filled 2016-01-01: qty 2

## 2016-01-01 MED ORDER — SODIUM CHLORIDE 0.9 % IV SOLN: INTRAVENOUS | Status: DC

## 2016-01-01 MED ORDER — MORPHINE SULFATE (PF) 2 MG/ML IV SOLN
2.0000 mg | Freq: Once | INTRAVENOUS | Status: AC
  Administered 2016-01-01: 2 mg via INTRAVENOUS
  Filled 2016-01-01: qty 1

## 2016-01-01 MED ORDER — ENOXAPARIN SODIUM 30 MG/0.3ML ~~LOC~~ SOLN
30.0000 mg | SUBCUTANEOUS | Status: DC
  Administered 2016-01-01: 30 mg via SUBCUTANEOUS
  Filled 2016-01-01: qty 0.3

## 2016-01-01 MED ORDER — ACETAMINOPHEN 650 MG RE SUPP: 650.0000 mg | Freq: Four times a day (QID) | RECTAL | Status: DC | PRN

## 2016-01-01 MED ORDER — LORAZEPAM 2 MG/ML IJ SOLN
0.5000 mg | Freq: Once | INTRAMUSCULAR | Status: AC
  Administered 2016-01-01: 0.5 mg via INTRAVENOUS
  Filled 2016-01-01: qty 1

## 2016-01-01 MED ORDER — ONDANSETRON HCL 4 MG/2ML IJ SOLN: 4.0000 mg | Freq: Four times a day (QID) | INTRAMUSCULAR | Status: DC | PRN

## 2016-01-01 NOTE — ED Notes (Signed)
MD at bedside. 

## 2016-01-01 NOTE — ED Notes (Signed)
Pt had a fall this am at 0300. Family got pt up at 0900 when they arrived. Pt complains of bilateral hip and buttock pain. Pt was able to ambulate with assistance to bathroom with family this am. No shortening or rotation noted. Strong pedal pulses. No LOC, did not hit head. Unsure why she fell.

## 2016-01-01 NOTE — ED Notes (Signed)
Pt needed a lot of help standing

## 2016-01-01 NOTE — ED Notes (Signed)
Bed: WA15 Expected date:  Expected time:  Means of arrival:  Comments: EMS-fall 

## 2016-01-01 NOTE — ED Provider Notes (Signed)
CSN: [id number]     Arrival date & time 01/01/16  1133 History   First MD Initiated Contact with Patient 01/01/16 1140     Chief Complaint  Patient presents with  . Fall  . Hip Pain     (Consider location/radiation/quality/duration/timing/severity/associated sxs/prior Treatment) Patient is a 82 y.o. female presenting with fall and hip pain. The history is provided by the patient.  Fall Pertinent negatives include no chest pain, no abdominal pain, no headaches and no shortness of breath.  Hip Pain Pertinent negatives include no chest pain, no abdominal pain, no headaches and no shortness of breath.  Patient s/p fall at home early this AM, ?3 AM.  Pt had gotten up from bed, went to kitchen, and fallen.  Pt/family indicates pt w slow, unsteady gait, 'weak knees' at baseline.  Patient unsure what caused fall, whether mechanical fall or syncope.  Pt indicates she just ended up on ground.  Denies loc but was unable to get up on own, and laid on floor for 5-6 hours. Patient c/o mid to lower back pain post fall. No radicular pain. No numbness/weakness. No neck pain. Denies head injury or headache.  Patient indicates recent health at baseline up until fall this AM.  Denies chest pain or sob. No palpitations. No other recent falls or syncope. Compliant w home meds.       Past Medical History  Diagnosis Date  . Hypertension    History reviewed. No pertinent past surgical history. History reviewed. No pertinent family history. Social History  Substance Use Topics  . Smoking status: None  . Smokeless tobacco: None  . Alcohol Use: None   OB History    No data available     Review of Systems  Constitutional: Negative for fever and chills.  HENT: Negative for sore throat.   Eyes: Negative for redness.  Respiratory: Negative for cough and shortness of breath.   Cardiovascular: Negative for chest pain.  Gastrointestinal: Negative for vomiting, abdominal pain and diarrhea.  Genitourinary:  Negative for flank pain.  Musculoskeletal: Positive for back pain. Negative for neck pain.  Skin: Negative for rash.  Neurological: Negative for numbness and headaches.  Hematological: Does not bruise/bleed easily.  Psychiatric/Behavioral: Negative for confusion.      Allergies  Review of patient's allergies indicates not on file.  Home Medications   Prior to Admission medications   Medication Sig Start Date End Date Taking? Authorizing Provider  aspirin 81 MG tablet Take 81 mg by mouth daily.    Historical Provider, MD  lisinopril-hydrochlorothiazide (PRINZIDE,ZESTORETIC) 20-25 MG per tablet Take 1 tablet by mouth daily.    Historical Provider, MD  Multiple Vitamin (MULTIVITAMIN) tablet Take 1 tablet by mouth daily.    Historical Provider, MD  Omega-3 Fatty Acids (FISH OIL) 1000 MG CAPS Take 1 capsule by mouth daily.    Historical Provider, MD   SpO2 98% Physical Exam  Constitutional: She appears well-developed and well-nourished. No distress.  HENT:  Head: Atraumatic.  Mouth/Throat: Oropharynx is clear and moist.  Eyes: Conjunctivae are normal. Pupils are equal, round, and reactive to light. No scleral icterus.  Neck: Neck supple. No tracheal deviation present.  Cardiovascular: Normal rate, regular rhythm, normal heart sounds and intact distal pulses.   Pulmonary/Chest: Effort normal and breath sounds normal. No respiratory distress. She exhibits no tenderness.  Abdominal: Soft. Normal appearance and bowel sounds are normal. She exhibits no distension. There is no tenderness.  Genitourinary:  No cva tenderness  Musculoskeletal: She exhibits  no edema.  Lower thoracic and upper lumbar tenderness, otherwise, CTLS spine, non tender, aligned, no step off. Good rom bil ext without pain or focal bony tenderness. Distal pulses palp bil.   Neurological: She is alert.  HOH. Speech clear/fluent. Motor intact bil, stre 5/5. sens grossly intact.   Skin: Skin is warm and dry. No rash  noted. She is not diaphoretic.  Psychiatric: She has a normal mood and affect.  Nursing note and vitals reviewed.   ED Course  Procedures (including critical care time) Labs Review   Results for orders placed or performed during the hospital encounter of 01/01/16  CBC  Result Value Ref Range   WBC 20.6 (H) 4.0 - 10.5 K/uL   RBC 3.53 (L) 3.87 - 5.11 MIL/uL   Hemoglobin 11.2 (L) 12.0 - 15.0 g/dL   HCT 33.7 (L) 36.0 - 46.0 %   MCV 95.5 78.0 - 100.0 fL   MCH 31.7 26.0 - 34.0 pg   MCHC 33.2 30.0 - 36.0 g/dL   RDW 17.3 (H) 11.5 - 15.5 %   Platelets 440 (H) 150 - 400 K/uL  Comprehensive metabolic panel  Result Value Ref Range   Sodium 135 135 - 145 mmol/L   Potassium 4.3 3.5 - 5.1 mmol/L   Chloride 98 (L) 101 - 111 mmol/L   CO2 25 22 - 32 mmol/L   Glucose, Bld 109 (H) 65 - 99 mg/dL   BUN 24 (H) 6 - 20 mg/dL   Creatinine, Ser 1.27 (H) 0.44 - 1.00 mg/dL   Calcium 9.0 8.9 - 10.3 mg/dL   Total Protein 7.4 6.5 - 8.1 g/dL   Albumin 3.9 3.5 - 5.0 g/dL   AST 20 15 - 41 U/L   ALT 14 14 - 54 U/L   Alkaline Phosphatase 124 38 - 126 U/L   Total Bilirubin 0.5 0.3 - 1.2 mg/dL   GFR calc non Af Amer 37 (L) >60 mL/min   GFR calc Af Amer 43 (L) >60 mL/min   Anion gap 12 5 - 15  Urinalysis, Routine w reflex microscopic (not at [facility])  Result Value Ref Range   Color, Urine YELLOW YELLOW   APPearance CLEAR CLEAR   Specific Gravity, Urine 1.016 1.005 - 1.030   pH 7.0 5.0 - 8.0   Glucose, UA NEGATIVE NEGATIVE mg/dL   Hgb urine dipstick NEGATIVE NEGATIVE   Bilirubin Urine NEGATIVE NEGATIVE   Ketones, ur NEGATIVE NEGATIVE mg/dL   Protein, ur NEGATIVE NEGATIVE mg/dL   Nitrite NEGATIVE NEGATIVE   Leukocytes, UA NEGATIVE NEGATIVE  CK  Result Value Ref Range   Total CK 150 38 - 234 U/L   Dg Thoracic Spine 2 View  01/01/2016  CLINICAL DATA:  Fall, back pain EXAM: THORACIC SPINE 2 VIEWS COMPARISON:  None. FINDINGS: Normal thoracic kyphosis. No evidence of fracture or dislocation. Vertebral body  heights are maintained. Mild multilevel degenerative changes. Visualized lungs are clear. IMPRESSION: No fracture or dislocation is seen. Electronically Signed   By: Julian Hy M.D.   On: 01/01/2016 13:17   Dg Lumbar Spine Complete  01/01/2016  CLINICAL DATA:  Fall, mid back pain EXAM: LUMBAR SPINE - COMPLETE 4+ VIEW COMPARISON:  None. FINDINGS: Five lumbar type vertebral bodies. Normal lumbar lordosis.  Mild lumbar levoscoliosis. No evidence of fracture or dislocation. Vertebral body heights are maintained. Mild multilevel degenerative changes. Visualized bony pelvis appears intact. Surgical clips in the right upper abdomen. IMPRESSION: No fracture or dislocation is seen. Mild degenerative changes with lumbar  levoscoliosis. Electronically Signed   By: Julian Hy M.D.   On: 01/01/2016 13:16   Dg Hip Unilat W Or W/o Pelvis 2-3 Views Left  01/01/2016  CLINICAL DATA:  Left hip/back pain, status post fall EXAM: DG HIP (WITH OR WITHOUT PELVIS) 2-3V LEFT COMPARISON:  None. FINDINGS: No fracture or dislocation is seen. Mild degenerative changes of the bilateral hips. Visualized bony pelvis appears intact. Moderate degenerative changes of the lower lumbar spine. IMPRESSION: No fracture or dislocation is seen. Electronically Signed   By: Julian Hy M.D.   On: 01/01/2016 13:15      I have personally reviewed and evaluated these images and lab results as part of my medical decision-making.   EKG Interpretation   Date/Time:  Sunday January 01 2016 12:07:22 EDT Ventricular Rate:  77 PR Interval:  200 QRS Duration: 108 QT Interval:  414 QTC Calculation: 468 R Axis:   -78 Text Interpretation:  Sinus rhythm Baseline wander in lead(s) III aVL No  previous tracing Confirmed by Ashok Cordia  MD, Lennette Bihari ([id number]) on 01/01/2016  12:55:52 PM      MDM   Iv ns.   Labs. Imaging studies.  Reviewed nursing notes and prior charts for additional history.   Pt very unclear on what caused fall,  initially ?report going to kitchen and then ending up on floor. ?possible syncope. Later patient states she thinks she fell out of bed, but family indicates do not think that is correct.  xrays neg for acute traumatic injury.   Room air pulse ox on recheck is 88-89% - will add cxr.  Wbc elevated, 20.  Pt denies fevers at home. No chills/sweats. ua neg. cxr pending.  Pt requests additional pain med. Morphine iv.    Also appeared anxious/upset, ativan .5 mg.  Given concern possible syncope, pt on ground x hours, gen weakness, will admit for observation and monitoring.      Lajean Saver, MD 01/01/16 [phone]

## 2016-01-01 NOTE — H&P (Signed)
[facility] History and Physical  Tracey Marks [id number] DOB: [birth year] DOA: 01/01/2016  Referring physician: er PCP: Lilian Coma, MD   Chief Complaint: fall  HPI: Tracey Marks is a 82 y.o. female  With PMHx of HTN, anemia (seen by Dr. Osker Mason) and hearing loss.  History is limited as patient is not very forth coming about details and husband and daughter did not witness.   Patient states she fell out of bed around 3 AM.  Husband says patient was found on the floor in their living room.  Daughter states patient has a tendency to lose her balance at baseline.   Patient denies fever, chills, CP, SOB.   In the ER no sign of infection was found further than leukocytosis (baseline is elevated and has been seen by Dr. Osker Mason).  U/A and xray of chest clean.  No fracture found.   Patient's only complaint is "leg pain" Orthostatics pending   Review of Systems:  Unable to do ROS as patient not cooperative  Past Medical History  Diagnosis Date  . Hypertension   . Hearing loss   . Vitamin D deficiency   . Chronic low back pain    History reviewed. No pertinent past surgical history. Social History:  reports that she has never smoked. She does not have any smokeless tobacco history on file. She reports that she does not drink alcohol or use illicit drugs.  No Known Allergies  Family History  Problem Relation Age of Onset  . Hypertension       Prior to Admission medications   Medication Sig Start Date End Date Taking? Authorizing Provider  aspirin 81 MG tablet Take 81 mg by mouth daily.   Yes Historical Provider, MD  lisinopril-hydrochlorothiazide (PRINZIDE,ZESTORETIC) 20-25 MG per tablet Take 1 tablet by mouth daily.   Yes Historical Provider, MD  Multiple Vitamin (MULTIVITAMIN) tablet Take 1 tablet by mouth daily.   Yes Historical Provider, MD   Physical Exam: Filed Vitals:   01/01/16 1137 01/01/16 1200 01/01/16 1245  BP:  114/59   Pulse:  76 81  Temp:  97.8 F  (36.6 C)   TempSrc:  Oral   Resp:  16 16  SpO2: 98% 93% 97%    Wt Readings from Last 3 Encounters:  03/30/15 74.481 kg (164 lb 3.2 oz)  12/29/14 74.254 kg (163 lb 11.2 oz)    General:  Hard of hearing, moving around in bed Eyes: PERRL, normal lids, irises & conjunctiva ENT: grossly normal hearing, lips & tongue Neck: no LAD, masses or thyromegaly Cardiovascular: RRR, no m/r/g. No LE edema. Telemetry: SR, no arrhythmias  Respiratory: CTA bilaterally, no w/r/r. Normal respiratory effort. Abdomen: soft, ntnd Skin: no rash or induration seen on limited exam Musculoskeletal: grossly normal tone BUE/BLE Psychiatric: grossly normal mood and affect, speech fluent and appropriate Neurologic: moves all 4 ext, no focal weakness          Labs on Admission:  Basic Metabolic Panel:  Recent Labs Lab 01/01/16 1307  NA 135  K 4.3  CL 98*  CO2 25  GLUCOSE 109*  BUN 24*  CREATININE 1.27*  CALCIUM 9.0   Liver Function Tests:  Recent Labs Lab 01/01/16 1307  AST 20  ALT 14  ALKPHOS 124  BILITOT 0.5  PROT 7.4  ALBUMIN 3.9   No results for input(s): LIPASE, AMYLASE in the last 168 hours. No results for input(s): AMMONIA in the last 168 hours. CBC:  Recent Labs Lab 01/01/16 1307  WBC 20.6*  HGB 11.2*  HCT 33.7*  MCV 95.5  PLT 440*   Cardiac Enzymes:  Recent Labs Lab 01/01/16 1307  CKTOTAL 150    BNP (last 3 results) No results for input(s): BNP in the last 8760 hours.  ProBNP (last 3 results) No results for input(s): PROBNP in the last 8760 hours.  CBG: No results for input(s): GLUCAP in the last 168 hours.  Radiological Exams on Admission: Dg Thoracic Spine 2 View  01/01/2016  CLINICAL DATA:  Fall, back pain EXAM: THORACIC SPINE 2 VIEWS COMPARISON:  None. FINDINGS: Normal thoracic kyphosis. No evidence of fracture or dislocation. Vertebral body heights are maintained. Mild multilevel degenerative changes. Visualized lungs are clear. IMPRESSION: No  fracture or dislocation is seen. Electronically Signed   By: Julian Hy M.D.   On: 01/01/2016 13:17   Dg Lumbar Spine Complete  01/01/2016  CLINICAL DATA:  Fall, mid back pain EXAM: LUMBAR SPINE - COMPLETE 4+ VIEW COMPARISON:  None. FINDINGS: Five lumbar type vertebral bodies. Normal lumbar lordosis.  Mild lumbar levoscoliosis. No evidence of fracture or dislocation. Vertebral body heights are maintained. Mild multilevel degenerative changes. Visualized bony pelvis appears intact. Surgical clips in the right upper abdomen. IMPRESSION: No fracture or dislocation is seen. Mild degenerative changes with lumbar levoscoliosis. Electronically Signed   By: Julian Hy M.D.   On: 01/01/2016 13:16   Dg Chest Port 1 View  01/01/2016  CLINICAL DATA:  82 year old female who fell today.  Weakness. EXAM: PORTABLE CHEST 1 VIEW COMPARISON:  Thoracic spine radiographs from today reported separately. FINDINGS: Portable AP semi upright view at 1446 hours. Upper limits of normal to hyperinflated lung volumes. Normal cardiac size and mediastinal contours. Visualized tracheal air column is within normal limits. Occasional small calcified granulomas otherwise, when allowing for portable technique the lungs are clear. Advanced degenerative changes at the left shoulder. Calcified aortic atherosclerosis. IMPRESSION: No acute cardiopulmonary abnormality. Electronically Signed   By: Genevie Ann M.D.   On: 01/01/2016 15:02   Dg Hip Unilat W Or W/o Pelvis 2-3 Views Left  01/01/2016  CLINICAL DATA:  Left hip/back pain, status post fall EXAM: DG HIP (WITH OR WITHOUT PELVIS) 2-3V LEFT COMPARISON:  None. FINDINGS: No fracture or dislocation is seen. Mild degenerative changes of the bilateral hips. Visualized bony pelvis appears intact. Moderate degenerative changes of the lower lumbar spine. IMPRESSION: No fracture or dislocation is seen. Electronically Signed   By: Julian Hy M.D.   On: 01/01/2016 13:15    EKG:  Independently reviewed.sinus with LAE  Assessment/Plan Active Problems:   Leucocytosis   Syncope and collapse   Fall   HTN (hypertension)   Presumed syncope/fall -tele Echo -TSH -orthostatics PT/OT eval  Anemia (mild) -check Fe panel -B12  Leukocytosis -recheck in AM with diff  HTN -hold home meds    Code Status: full DVT Prophylaxis: Family Communication: daughter and husband at bedside Disposition Plan:   Time spent: 42 min  [facility] Pager [phone]

## 2016-01-01 NOTE — ED Notes (Signed)
Patient transported to X-ray 

## 2016-01-02 ENCOUNTER — Observation Stay (HOSPITAL_BASED_OUTPATIENT_CLINIC_OR_DEPARTMENT_OTHER): Payer: Medicare Other

## 2016-01-02 DIAGNOSIS — D72829 Elevated white blood cell count, unspecified: Secondary | ICD-10-CM | POA: Diagnosis not present

## 2016-01-02 DIAGNOSIS — I10 Essential (primary) hypertension: Secondary | ICD-10-CM | POA: Diagnosis not present

## 2016-01-02 DIAGNOSIS — R55 Syncope and collapse: Secondary | ICD-10-CM | POA: Diagnosis not present

## 2016-01-02 DIAGNOSIS — W19XXXA Unspecified fall, initial encounter: Secondary | ICD-10-CM | POA: Diagnosis not present

## 2016-01-02 LAB — CBC WITH DIFFERENTIAL/PLATELET
Basophils Absolute: 0 10*3/uL (ref 0.0–0.1)
Basophils Relative: 0 %
EOS ABS: 0.2 10*3/uL (ref 0.0–0.7)
Eosinophils Relative: 1 %
HEMATOCRIT: 32.4 % — AB (ref 36.0–46.0)
HEMOGLOBIN: 10.7 g/dL — AB (ref 12.0–15.0)
LYMPHS ABS: 1.5 10*3/uL (ref 0.7–4.0)
Lymphocytes Relative: 10 %
MCH: 31.8 pg (ref 26.0–34.0)
MCHC: 33 g/dL (ref 30.0–36.0)
MCV: 96.1 fL (ref 78.0–100.0)
MONOS PCT: 5 %
Monocytes Absolute: 0.8 10*3/uL (ref 0.1–1.0)
NEUTROS ABS: 13 10*3/uL — AB (ref 1.7–7.7)
NEUTROS PCT: 84 %
Platelets: 393 10*3/uL (ref 150–400)
RBC: 3.37 MIL/uL — ABNORMAL LOW (ref 3.87–5.11)
RDW: 17.2 % — ABNORMAL HIGH (ref 11.5–15.5)
WBC: 15.5 10*3/uL — ABNORMAL HIGH (ref 4.0–10.5)

## 2016-01-02 LAB — BASIC METABOLIC PANEL
Anion gap: 9 (ref 5–15)
BUN: 23 mg/dL — AB (ref 6–20)
CO2: 27 mmol/L (ref 22–32)
Calcium: 8.7 mg/dL — ABNORMAL LOW (ref 8.9–10.3)
Chloride: 100 mmol/L — ABNORMAL LOW (ref 101–111)
Creatinine, Ser: 1.18 mg/dL — ABNORMAL HIGH (ref 0.44–1.00)
GFR, EST AFRICAN AMERICAN: 47 mL/min — AB (ref >60)
GFR, EST NON AFRICAN AMERICAN: 41 mL/min — AB (ref >60)
Glucose, Bld: 106 mg/dL — ABNORMAL HIGH (ref 65–99)
POTASSIUM: 4.2 mmol/L (ref 3.5–5.1)
SODIUM: 136 mmol/L (ref 135–145)

## 2016-01-02 LAB — TROPONIN I: Troponin I: <0.03 ng/mL (ref <0.031)

## 2016-01-02 LAB — VITAMIN B12: Vitamin B-12: 392 pg/mL (ref 180–914)

## 2016-01-02 LAB — FERRITIN: Ferritin: 54 ng/mL (ref 11–307)

## 2016-01-02 LAB — IRON AND TIBC
IRON: 93 ug/dL (ref 28–170)
Saturation Ratios: 23 % (ref 10.4–31.8)
TIBC: 407 ug/dL (ref 250–450)
UIBC: 314 ug/dL

## 2016-01-02 LAB — ECHOCARDIOGRAM COMPLETE
HEIGHTINCHES: 65 in
Weight: 2345.69 oz

## 2016-01-02 LAB — GLUCOSE, CAPILLARY: Glucose-Capillary: 94 mg/dL (ref 65–99)

## 2016-01-02 MED ORDER — OXYCODONE HCL 5 MG PO TABS: 5.0000 mg | ORAL_TABLET | ORAL | Status: AC | PRN

## 2016-01-02 NOTE — Plan of Care (Signed)
Problem: Safety: Goal: Ability to remain free from injury will improve Outcome: Progressing Continue with fall precautions.  High risk for fall.   Family at bedside.  Problem: Pain Managment: Goal: General experience of comfort will improve Outcome: Progressing Continue with pain management.       Problem: Physical Regulation: Goal: Will remain free from infection Outcome: Progressing WBC 15.5 today, trending down. Afebrile.    Problem: Activity: Goal: Risk for activity intolerance will decrease Outcome: Progressing Pt up with 2 person assist to chair and bedside commode.   Hip pain with movement.   Orders for PT/OT.    Problem: Nutrition: Goal: Adequate nutrition will be maintained Outcome: Completed/Met Date Met:  01/02/16 .

## 2016-01-02 NOTE — Progress Notes (Signed)
  Echocardiogram 2D Echocardiogram has been performed.  Darlina Sicilian M 01/02/2016, 2:34 PM

## 2016-01-02 NOTE — Discharge Summary (Signed)
Physician Discharge Summary  Tracey Marks [id number] DOB: 04/03/1928 DOA: 01/01/2016  PCP: Lilian Coma, MD  Admit date: 01/01/2016 Discharge date: 01/02/2016  Time spent: 35 minutes  Recommendations for Outpatient Follow-up:  Outpatient sleep study Home health Cbc 1 week Bowel regimen while on pain medications  Discharge Diagnoses:  Active Problems:   Leucocytosis   Syncope and collapse   Fall   HTN (hypertension)   Discharge Condition: stable  Diet recommendation: regular  Filed Weights   01/01/16 1722 01/02/16 0608  Weight: 68.9 kg (151 lb 14.4 oz) 66.5 kg (146 lb 9.7 oz)    History of present illness:  Tracey Marks is a 82 y.o. female  With PMHx of HTN, anemia (seen by Dr. Osker Mason) and hearing loss. History is limited as patient is not very forth coming about details and husband and daughter did not witness.  Patient states she fell out of bed around 3 AM. Husband says patient was found on the floor in their living room. Daughter states patient has a tendency to lose her balance at baseline.  Patient denies fever, chills, CP, SOB.  In the ER no sign of infection was found further than leukocytosis (baseline is elevated and has been seen by Dr. Osker Mason). U/A and xray of chest clean. No fracture found.  Patient's only complaint is "leg pain"   Hospital Course:  Presumed syncope/fall -tele ok Echo: Left ventricle: The cavity size was normal. Wall thickness was  normal. Systolic function was normal. The estimated ejection  fraction was in the range of 60% to 65%. Doppler parameters are  consistent with abnormal left ventricular relaxation (grade 1  diastolic dysfunction). - Atrial septum: No defect or patent foramen ovale was identified. -TSH ok -orthostatics normal but after IVF in ER PT/OT eval- SNF but patient in observation status and family unable to pay for SNF-- max out home health O2 94-96% on RA- did desat some at night- outpatient sleep  study per PCP? -family at bedside states patient has been falling for a while now  Anemia (mild) -Fe panel ok -B12 ok  Leukocytosis Has seen Dr. Osker Mason-- improved with no abx  HTN -d/c home med Encourage PO intake  Procedures:    Consultations:    Discharge Exam: Filed Vitals:   01/02/16 0608 01/02/16 1431  BP: 112/52 108/48  Pulse: 73 77  Temp: 98.3 F (36.8 C) 98.1 F (36.7 C)  Resp:  18    General: awake, NAD   Discharge Instructions   Discharge Instructions    Diet general    Complete by:  As directed      Discharge instructions    Complete by:  As directed   24 hour supervision Home health- RN/PT/OT/Aide/Social work Cbc, bmp 1 week     Increase activity slowly    Complete by:  As directed           Current Discharge Medication List    START taking these medications   Details  oxyCODONE (OXY IR/ROXICODONE) 5 MG immediate release tablet Take 1 tablet (5 mg total) by mouth every 4 (four) hours as needed for moderate pain. Qty: 20 tablet, Refills: 0      CONTINUE these medications which have NOT CHANGED   Details  aspirin 81 MG tablet Take 81 mg by mouth daily.   Associated Diagnoses: Iron deficiency anemia due to chronic blood loss; Macrocytosis; Folic acid deficiency    Multiple Vitamin (MULTIVITAMIN) tablet Take 1 tablet by mouth daily.  Associated Diagnoses: Iron deficiency anemia due to chronic blood loss; Macrocytosis; Folic acid deficiency      STOP taking these medications     lisinopril-hydrochlorothiazide (PRINZIDE,ZESTORETIC) 20-25 MG per tablet        No Known Allergies Follow-up Information    Follow up with Lilian Coma, MD In 1 week.   Specialty:  Family Medicine   Contact information:   [place] [id number] [phone]        The results of significant diagnostics from this hospitalization (including imaging, microbiology, ancillary and laboratory) are listed below for  reference.    Significant Diagnostic Studies: Dg Thoracic Spine 2 View  01/01/2016  CLINICAL DATA:  Fall, back pain EXAM: THORACIC SPINE 2 VIEWS COMPARISON:  None. FINDINGS: Normal thoracic kyphosis. No evidence of fracture or dislocation. Vertebral body heights are maintained. Mild multilevel degenerative changes. Visualized lungs are clear. IMPRESSION: No fracture or dislocation is seen. Electronically Signed   By: Julian Hy M.D.   On: 01/01/2016 13:17   Dg Lumbar Spine Complete  01/01/2016  CLINICAL DATA:  Fall, mid back pain EXAM: LUMBAR SPINE - COMPLETE 4+ VIEW COMPARISON:  None. FINDINGS: Five lumbar type vertebral bodies. Normal lumbar lordosis.  Mild lumbar levoscoliosis. No evidence of fracture or dislocation. Vertebral body heights are maintained. Mild multilevel degenerative changes. Visualized bony pelvis appears intact. Surgical clips in the right upper abdomen. IMPRESSION: No fracture or dislocation is seen. Mild degenerative changes with lumbar levoscoliosis. Electronically Signed   By: Julian Hy M.D.   On: 01/01/2016 13:16   Dg Chest Port 1 View  01/01/2016  CLINICAL DATA:  82 year old female who fell today.  Weakness. EXAM: PORTABLE CHEST 1 VIEW COMPARISON:  Thoracic spine radiographs from today reported separately. FINDINGS: Portable AP semi upright view at 1446 hours. Upper limits of normal to hyperinflated lung volumes. Normal cardiac size and mediastinal contours. Visualized tracheal air column is within normal limits. Occasional small calcified granulomas otherwise, when allowing for portable technique the lungs are clear. Advanced degenerative changes at the left shoulder. Calcified aortic atherosclerosis. IMPRESSION: No acute cardiopulmonary abnormality. Electronically Signed   By: Genevie Ann M.D.   On: 01/01/2016 15:02   Dg Hip Unilat W Or W/o Pelvis 2-3 Views Left  01/01/2016  CLINICAL DATA:  Left hip/back pain, status post fall EXAM: DG HIP (WITH OR WITHOUT  PELVIS) 2-3V LEFT COMPARISON:  None. FINDINGS: No fracture or dislocation is seen. Mild degenerative changes of the bilateral hips. Visualized bony pelvis appears intact. Moderate degenerative changes of the lower lumbar spine. IMPRESSION: No fracture or dislocation is seen. Electronically Signed   By: Julian Hy M.D.   On: 01/01/2016 13:15    Microbiology: No results found for this or any previous visit (from the past 240 hour(s)).   Labs: Basic Metabolic Panel:  Recent Labs Lab 01/01/16 1307 01/02/16 0518  NA 135 136  K 4.3 4.2  CL 98* 100*  CO2 25 27  GLUCOSE 109* 106*  BUN 24* 23*  CREATININE 1.27* 1.18*  CALCIUM 9.0 8.7*   Liver Function Tests:  Recent Labs Lab 01/01/16 1307  AST 20  ALT 14  ALKPHOS 124  BILITOT 0.5  PROT 7.4  ALBUMIN 3.9   No results for input(s): LIPASE, AMYLASE in the last 168 hours. No results for input(s): AMMONIA in the last 168 hours. CBC:  Recent Labs Lab 01/01/16 1307 01/02/16 0518  WBC 20.6* 15.5*  NEUTROABS  --  13.0*  HGB 11.2* 10.7*  HCT 33.7* 32.4*  MCV 95.5 96.1  PLT 440* 393   Cardiac Enzymes:  Recent Labs Lab 01/01/16 1307 01/01/16 1850 01/01/16 2336 01/02/16 0518  CKTOTAL 150  --   --   --   TROPONINI  --  <0.03 <0.03 <0.03   BNP: BNP (last 3 results) No results for input(s): BNP in the last 8760 hours.  ProBNP (last 3 results) No results for input(s): PROBNP in the last 8760 hours.  CBG:  Recent Labs Lab 01/02/16 0611  GLUCAP 94       Signed:  Izaia Say U Analyce Tavares DO.  [facility] 01/02/2016, 3:12 PM

## 2016-01-02 NOTE — Care Management Obs Status (Signed)
[facility] NOTIFICATION   Patient Details  Name: Tracey Marks MRN: [id number] Date of Birth: [birth year]   Medicare Observation Status Notification Given:  Yes    Purcell Mouton, RN 01/02/2016, 1:07 PM

## 2016-01-02 NOTE — Progress Notes (Signed)
CSW & RNCM, Cookie spoke with patient, her husband & daughter-in-Jerry, Arrie Aran at bedside re: discharge plans. CSW reviewed PT evaluation recommendation though patient is currently here under "observation" status. CSW explained that Medicare will not cover SNF stay if patient has been under "observation" status. RNCM to arrange for Home Health services.   No further CSW needs identified - CSW signing off.   Raynaldo Opitz, [facility] Clinical Social Worker cell #: [phone]

## 2016-01-02 NOTE — Progress Notes (Signed)
OT Cancellation Note  Patient Details Name: Tracey Marks MRN: [id number] DOB: 07/09/1928   Cancelled Treatment:     Pt sleeping soundly- no family present. Reviewed PT note.  Recommend HHOT for pt if family agreeable   Aloura Matsuoka, Thereasa Parkin

## 2016-01-02 NOTE — Progress Notes (Signed)
Patient discharged via wheelchair in stable condition.  Educated pts daughter in [place] regarding medications, discharge instructions, signs and symptoms to report.  Follow up appt made.  Prescription given.  Teach back completed.

## 2016-01-02 NOTE — Care Management Note (Signed)
Case Management Note  Patient Details  Name: Tracey Marks MRN: [id number] Date of Birth: 30-Apr-1928  Subjective/Objective:                    Action/Plan: Home with daughter and HHRN,PT,NA,CSW   Expected Discharge Date:                  Expected Discharge Plan:  [place]  In-House Referral:  Clinical Social Work  Discharge planning Services  CM Consult  Post Acute Care Choice:    Choice offered to:  Patient, Adult Children  DME Arranged:    DME Agency:  [facility] Arranged:  RN, PT, OT, Social Work, NA [place] Agency:  [facility]  Status of Service:  In process, will continue to follow  Medicare Important Message Given:    Date Medicare IM Given:    Medicare IM give by:    Date Additional Medicare IM Given:    Additional Medicare Important Message give by:     If discussed at [place] of Stay Meetings, dates discussed:    Additional Comments:  Purcell Mouton, RN 01/02/2016, 1:20 PM

## 2016-01-02 NOTE — Evaluation (Addendum)
Physical Therapy Evaluation Patient Details Name: Tracey Marks MRN: [id number] DOB: 11-18-1927 Today's Date: 01/02/2016   History of Present Illness  82 yo female admitted after sustaining fall at home. Imaging on 3/26 was (-) fx.   Clinical Impression  On eval, pt required Mod assist for mobility-walked ~7 feet with RW. Pain moderate-severe with activity in L LE and sacral area. Family present during session. Discussed d/c plan-family would like ST rehab placement and pt would benefit as well. Recommended family discuss options with CM and SW. Recommend ST rehab at SNF, if possible. O2 sats 94% on RA at rest. Made NT aware and asked her to spot-check pt to ensure levels remain within safe levels.     Follow Up Recommendations SNF    Equipment Recommendations  Rolling walker with 5" wheels; (may possibly need ambulance transport if pt returns home)    Recommendations for Other Services       Precautions / Restrictions Precautions Precautions: Fall Restrictions Weight Bearing Restrictions: No      Mobility  Bed Mobility Overal bed mobility: Needs Assistance Bed Mobility: Supine to Sit     Supine to sit: HOB elevated;Mod assist     General bed mobility comments: Assist with trunk and bil LEs. Increased time.   Transfers Overall transfer level: Needs assistance Equipment used: Rolling walker (2 wheeled) Transfers: Sit to/from Stand Sit to Stand: Mod assist;From elevated surface         General transfer comment: Assist to rise, stabilize, control descent. Multimodal cues for safety, hand placement.   Ambulation/Gait Ambulation/Gait assistance: Min assist Ambulation Distance (Feet): 7 Feet Assistive device: Rolling walker (2 wheeled) Gait Pattern/deviations: Step-to pattern;Antalgic     General Gait Details: Assist to stabilize pt and maneuver safely with RW. VCs safety, technique, sequence. Distance limited by pain. Followed with recliner.   Stairs             Wheelchair Mobility    Modified Rankin (Stroke Patients Only)       Balance Overall balance assessment: Needs assistance         Standing balance support: Bilateral upper extremity supported;During functional activity Standing balance-Leahy Scale: Poor                               Pertinent Vitals/Pain Pain Assessment: Faces Faces Pain Scale: Hurts whole lot Pain Location: L LE and sacral area Pain Descriptors / Indicators: Sore;Sharp Pain Intervention(s): Limited activity within patient's tolerance;Repositioned    Home Living Family/patient expects to be discharged to:: Private residence Living Arrangements: Spouse/significant other   Type of Home: House Home Access: Stairs to enter Entrance Stairs-Rails: Right Entrance Stairs-Number of Steps: 3 Home Layout: One level Home Equipment: Cane - single point      Prior Function Level of Independence: Independent with assistive device(s)         Comments: uses cane     Hand Dominance        Extremity/Trunk Assessment   Upper Extremity Assessment: Generalized weakness           Lower Extremity Assessment: Generalized weakness;LLE deficits/detail   LLE Deficits / Details: pain with moving leg and with WBing  Cervical / Trunk Assessment: Kyphotic  Communication   Communication: HOH  Cognition Arousal/Alertness: Awake/alert Behavior During Therapy: WFL for tasks assessed/performed Overall Cognitive Status: Within Functional Limits for tasks assessed  General Comments      Exercises        Assessment/Plan    PT Assessment Patient needs continued PT services  PT Diagnosis Difficulty walking;Abnormality of gait;Generalized weakness;Acute pain   PT Problem List Decreased strength;Decreased activity tolerance;Decreased balance;Decreased mobility;Decreased knowledge of use of DME;Pain  PT Treatment Interventions DME instruction;Gait training;Functional  mobility training;Therapeutic activities;Patient/family education;Balance training;Therapeutic exercise   PT Goals (Current goals can be found in the Care Plan section) Acute Rehab PT Goals Patient Stated Goal: per family, would like ST rehab  PT Goal Formulation: With patient/family Time For Goal Achievement: 01/16/16 Potential to Achieve Goals: Good    Frequency Min 3X/week   Barriers to discharge        Co-evaluation               End of Session Equipment Utilized During Treatment: Gait belt Activity Tolerance: Patient limited by fatigue;Patient limited by pain Patient left: in chair;with call bell/phone within reach;with chair alarm set;with family/visitor present      Functional Assessment Tool Used: clinical judgement Functional Limitation: Mobility: Walking and moving around Mobility: Walking and Moving Around Current Status [phone]): At least 1 percent but less than 20 percent impaired, limited or restricted Mobility: Walking and Moving Around Goal Status [phone]): At least 60 percent but less than 80 percent impaired, limited or restricted    Time: 1125-1151 PT Time Calculation (min) (ACUTE ONLY): 26 min   Charges:   PT Evaluation $PT Eval Low Complexity: 1 Procedure PT Treatments $Gait Training: 8-22 mins   PT G Codes:   PT G-Codes **NOT FOR INPATIENT CLASS** Functional Assessment Tool Used: clinical judgement Functional Limitation: Mobility: Walking and moving around Mobility: Walking and Moving Around Current Status [id number]): At least 1 percent but less than 20 percent impaired, limited or restricted Mobility: Walking and Moving Around Goal Status [phone]): At least 60 percent but less than 80 percent impaired, limited or restricted    Tracey Marks, MPT Pager: [phone]

## 2016-01-02 NOTE — Progress Notes (Signed)
Spoke to pt, husband and daughter in [place] at bedside with [facility], [place] concerning Obs status. Explained to pt that she would need to pay for SNF out of pocket related to her being obs status.  Pt states, "I am going home."  Daughter in [place] states that she will take pt home with her.  They had no preference for [facility].  [facility] selected.

## 2016-01-02 NOTE — Progress Notes (Signed)
[facility]    [facility] is providing the following services: RW  If patient discharges after hours, please call [phone].   Tracey Marks 01/02/2016, 3:28 PM

## 2016-01-04 DIAGNOSIS — Z9181 History of falling: Secondary | ICD-10-CM | POA: Diagnosis not present

## 2016-01-04 DIAGNOSIS — Z7982 Long term (current) use of aspirin: Secondary | ICD-10-CM | POA: Diagnosis not present

## 2016-01-04 DIAGNOSIS — D649 Anemia, unspecified: Secondary | ICD-10-CM | POA: Diagnosis not present

## 2016-01-04 DIAGNOSIS — I10 Essential (primary) hypertension: Secondary | ICD-10-CM | POA: Diagnosis not present

## 2016-01-04 DIAGNOSIS — M25552 Pain in left hip: Secondary | ICD-10-CM | POA: Diagnosis not present

## 2016-01-04 DIAGNOSIS — R55 Syncope and collapse: Secondary | ICD-10-CM | POA: Diagnosis not present

## 2016-01-05 DIAGNOSIS — I10 Essential (primary) hypertension: Secondary | ICD-10-CM | POA: Diagnosis not present

## 2016-01-05 DIAGNOSIS — Z9181 History of falling: Secondary | ICD-10-CM | POA: Diagnosis not present

## 2016-01-05 DIAGNOSIS — Z7982 Long term (current) use of aspirin: Secondary | ICD-10-CM | POA: Diagnosis not present

## 2016-01-05 DIAGNOSIS — R55 Syncope and collapse: Secondary | ICD-10-CM | POA: Diagnosis not present

## 2016-01-05 DIAGNOSIS — D649 Anemia, unspecified: Secondary | ICD-10-CM | POA: Diagnosis not present

## 2016-01-05 DIAGNOSIS — M25552 Pain in left hip: Secondary | ICD-10-CM | POA: Diagnosis not present

## 2016-01-06 DIAGNOSIS — Z9181 History of falling: Secondary | ICD-10-CM | POA: Diagnosis not present

## 2016-01-06 DIAGNOSIS — D649 Anemia, unspecified: Secondary | ICD-10-CM | POA: Diagnosis not present

## 2016-01-06 DIAGNOSIS — R55 Syncope and collapse: Secondary | ICD-10-CM | POA: Diagnosis not present

## 2016-01-06 DIAGNOSIS — M533 Sacrococcygeal disorders, not elsewhere classified: Secondary | ICD-10-CM | POA: Diagnosis not present

## 2016-01-06 DIAGNOSIS — Z7982 Long term (current) use of aspirin: Secondary | ICD-10-CM | POA: Diagnosis not present

## 2016-01-06 DIAGNOSIS — I10 Essential (primary) hypertension: Secondary | ICD-10-CM | POA: Diagnosis not present

## 2016-01-06 DIAGNOSIS — M25552 Pain in left hip: Secondary | ICD-10-CM | POA: Diagnosis not present

## 2016-01-09 DIAGNOSIS — I10 Essential (primary) hypertension: Secondary | ICD-10-CM | POA: Diagnosis not present

## 2016-01-09 DIAGNOSIS — M25552 Pain in left hip: Secondary | ICD-10-CM | POA: Diagnosis not present

## 2016-01-09 DIAGNOSIS — Z7982 Long term (current) use of aspirin: Secondary | ICD-10-CM | POA: Diagnosis not present

## 2016-01-09 DIAGNOSIS — R55 Syncope and collapse: Secondary | ICD-10-CM | POA: Diagnosis not present

## 2016-01-09 DIAGNOSIS — Z9181 History of falling: Secondary | ICD-10-CM | POA: Diagnosis not present

## 2016-01-09 DIAGNOSIS — D649 Anemia, unspecified: Secondary | ICD-10-CM | POA: Diagnosis not present

## 2016-01-10 DIAGNOSIS — D649 Anemia, unspecified: Secondary | ICD-10-CM | POA: Diagnosis not present

## 2016-01-10 DIAGNOSIS — M25552 Pain in left hip: Secondary | ICD-10-CM | POA: Diagnosis not present

## 2016-01-10 DIAGNOSIS — I10 Essential (primary) hypertension: Secondary | ICD-10-CM | POA: Diagnosis not present

## 2016-01-10 DIAGNOSIS — R55 Syncope and collapse: Secondary | ICD-10-CM | POA: Diagnosis not present

## 2016-01-10 DIAGNOSIS — Z7982 Long term (current) use of aspirin: Secondary | ICD-10-CM | POA: Diagnosis not present

## 2016-01-10 DIAGNOSIS — Z9181 History of falling: Secondary | ICD-10-CM | POA: Diagnosis not present

## 2016-01-11 DIAGNOSIS — Z111 Encounter for screening for respiratory tuberculosis: Secondary | ICD-10-CM | POA: Diagnosis not present

## 2016-01-11 DIAGNOSIS — M25552 Pain in left hip: Secondary | ICD-10-CM | POA: Diagnosis not present

## 2016-01-11 DIAGNOSIS — Z9181 History of falling: Secondary | ICD-10-CM | POA: Diagnosis not present

## 2016-01-11 DIAGNOSIS — R55 Syncope and collapse: Secondary | ICD-10-CM | POA: Diagnosis not present

## 2016-01-11 DIAGNOSIS — I10 Essential (primary) hypertension: Secondary | ICD-10-CM | POA: Diagnosis not present

## 2016-01-11 DIAGNOSIS — Z7982 Long term (current) use of aspirin: Secondary | ICD-10-CM | POA: Diagnosis not present

## 2016-01-11 DIAGNOSIS — D649 Anemia, unspecified: Secondary | ICD-10-CM | POA: Diagnosis not present

## 2016-01-12 DIAGNOSIS — Z9181 History of falling: Secondary | ICD-10-CM | POA: Diagnosis not present

## 2016-01-12 DIAGNOSIS — Z7982 Long term (current) use of aspirin: Secondary | ICD-10-CM | POA: Diagnosis not present

## 2016-01-12 DIAGNOSIS — R55 Syncope and collapse: Secondary | ICD-10-CM | POA: Diagnosis not present

## 2016-01-12 DIAGNOSIS — I10 Essential (primary) hypertension: Secondary | ICD-10-CM | POA: Diagnosis not present

## 2016-01-12 DIAGNOSIS — D649 Anemia, unspecified: Secondary | ICD-10-CM | POA: Diagnosis not present

## 2016-01-12 DIAGNOSIS — M25552 Pain in left hip: Secondary | ICD-10-CM | POA: Diagnosis not present

## 2016-01-13 DIAGNOSIS — Z9181 History of falling: Secondary | ICD-10-CM | POA: Diagnosis not present

## 2016-01-13 DIAGNOSIS — M25552 Pain in left hip: Secondary | ICD-10-CM | POA: Diagnosis not present

## 2016-01-13 DIAGNOSIS — I10 Essential (primary) hypertension: Secondary | ICD-10-CM | POA: Diagnosis not present

## 2016-01-13 DIAGNOSIS — Z7982 Long term (current) use of aspirin: Secondary | ICD-10-CM | POA: Diagnosis not present

## 2016-01-13 DIAGNOSIS — D649 Anemia, unspecified: Secondary | ICD-10-CM | POA: Diagnosis not present

## 2016-01-13 DIAGNOSIS — R55 Syncope and collapse: Secondary | ICD-10-CM | POA: Diagnosis not present

## 2016-01-21 ENCOUNTER — Encounter (HOSPITAL_COMMUNITY): Payer: Self-pay | Admitting: Physician Assistant

## 2016-01-21 ENCOUNTER — Observation Stay (HOSPITAL_COMMUNITY)
Admission: AD | Admit: 2016-01-21 | Discharge: 2016-01-24 | Disposition: A | Discharge status: Home Health | Payer: Medicare Other | Source: Other Acute Inpatient Hospital | Attending: Internal Medicine | Admitting: Internal Medicine

## 2016-01-21 DIAGNOSIS — M40204 Unspecified kyphosis, thoracic region: Secondary | ICD-10-CM | POA: Diagnosis not present

## 2016-01-21 DIAGNOSIS — D62 Acute posthemorrhagic anemia: Secondary | ICD-10-CM | POA: Diagnosis not present

## 2016-01-21 DIAGNOSIS — Z86010 Personal history of colonic polyps: Secondary | ICD-10-CM | POA: Diagnosis not present

## 2016-01-21 DIAGNOSIS — H9190 Unspecified hearing loss, unspecified ear: Secondary | ICD-10-CM

## 2016-01-21 DIAGNOSIS — R531 Weakness: Secondary | ICD-10-CM | POA: Diagnosis not present

## 2016-01-21 DIAGNOSIS — R0902 Hypoxemia: Secondary | ICD-10-CM | POA: Diagnosis not present

## 2016-01-21 DIAGNOSIS — Z7982 Long term (current) use of aspirin: Secondary | ICD-10-CM | POA: Diagnosis not present

## 2016-01-21 DIAGNOSIS — F0390 Unspecified dementia without behavioral disturbance: Secondary | ICD-10-CM | POA: Insufficient documentation

## 2016-01-21 DIAGNOSIS — D72829 Elevated white blood cell count, unspecified: Secondary | ICD-10-CM | POA: Diagnosis present

## 2016-01-21 DIAGNOSIS — K922 Gastrointestinal hemorrhage, unspecified: Secondary | ICD-10-CM | POA: Diagnosis not present

## 2016-01-21 DIAGNOSIS — K625 Hemorrhage of anus and rectum: Secondary | ICD-10-CM | POA: Diagnosis not present

## 2016-01-21 DIAGNOSIS — I10 Essential (primary) hypertension: Secondary | ICD-10-CM | POA: Diagnosis present

## 2016-01-21 DIAGNOSIS — D638 Anemia in other chronic diseases classified elsewhere: Secondary | ICD-10-CM | POA: Diagnosis present

## 2016-01-21 DIAGNOSIS — J9811 Atelectasis: Secondary | ICD-10-CM | POA: Diagnosis not present

## 2016-01-21 LAB — HEMOGLOBIN AND HEMATOCRIT, BLOOD
HCT: 25.3 % — ABNORMAL LOW (ref 36.0–46.0)
HEMATOCRIT: 28.7 % — AB (ref 36.0–46.0)
HEMATOCRIT: 22.6 % — AB (ref 36.0–46.0)
HEMOGLOBIN: 9.2 g/dL — AB (ref 12.0–15.0)
HEMOGLOBIN: 8.5 g/dL — AB (ref 12.0–15.0)
Hemoglobin: 7.3 g/dL — ABNORMAL LOW (ref 12.0–15.0)

## 2016-01-21 LAB — MRSA PCR SCREENING: MRSA BY PCR: NEGATIVE

## 2016-01-21 MED ORDER — SODIUM CHLORIDE 0.9 % IV SOLN
INTRAVENOUS | Status: AC
  Administered 2016-01-21 – 2016-01-22 (×2): via INTRAVENOUS

## 2016-01-21 MED ORDER — MORPHINE SULFATE (PF) 2 MG/ML IV SOLN: 1.0000 mg | INTRAVENOUS | Status: DC | PRN

## 2016-01-21 MED ORDER — TRAZODONE HCL 50 MG PO TABS: 25.0000 mg | ORAL_TABLET | Freq: Every evening | ORAL | Status: DC | PRN

## 2016-01-21 MED ORDER — MAGNESIUM CITRATE PO SOLN: 1.0000 | Freq: Once | ORAL | Status: DC | PRN

## 2016-01-21 MED ORDER — TRAMADOL HCL 50 MG PO TABS: 50.0000 mg | ORAL_TABLET | Freq: Four times a day (QID) | ORAL | Status: DC | PRN

## 2016-01-21 MED ORDER — BISACODYL 10 MG RE SUPP: 10.0000 mg | Freq: Every day | RECTAL | Status: DC | PRN

## 2016-01-21 MED ORDER — ACETAMINOPHEN 325 MG PO TABS: 650.0000 mg | ORAL_TABLET | Freq: Four times a day (QID) | ORAL | Status: DC | PRN

## 2016-01-21 MED ORDER — HYDROCODONE-ACETAMINOPHEN 5-325 MG PO TABS: 1.0000 | ORAL_TABLET | ORAL | Status: DC | PRN

## 2016-01-21 MED ORDER — SENNOSIDES-DOCUSATE SODIUM 8.6-50 MG PO TABS: 1.0000 | ORAL_TABLET | Freq: Every evening | ORAL | Status: DC | PRN

## 2016-01-21 MED ORDER — ACETAMINOPHEN 650 MG RE SUPP: 650.0000 mg | Freq: Four times a day (QID) | RECTAL | Status: DC | PRN

## 2016-01-21 MED ORDER — ONDANSETRON HCL 4 MG PO TABS: 4.0000 mg | ORAL_TABLET | Freq: Four times a day (QID) | ORAL | Status: DC | PRN

## 2016-01-21 MED ORDER — ONDANSETRON HCL 4 MG/2ML IJ SOLN
4.0000 mg | Freq: Four times a day (QID) | INTRAMUSCULAR | Status: DC | PRN
Start: 2016-01-21 — End: 2016-01-24

## 2016-01-21 MED ORDER — OXYCODONE HCL 5 MG PO TABS: 5.0000 mg | ORAL_TABLET | ORAL | Status: DC | PRN

## 2016-01-21 NOTE — H&P (Signed)
[facility] History and Physical  Tracey Marks [id number] DOB: [birth year] DOA: 01/21/2016   PCP: Lilian Coma, MD   Chief Complaint: Rectal Bleeding  HPI: Tracey Marks is a 82 y.o. female with a PMH remarkable for anemia, mild leukocytosis followed by Hematology, HTN, HOH, dementia, recently admitted on 3/26 through 3/27  after sustaining a fall due to a presumed syncopal episode, at which time testing was inconclusive, now brought by EMS for evaluation of rectal bleeding. She was transferred from [facility], after she presented with an episode of heavy rectal bleeding yielding a large clot. Symptoms did not subside, she was brought here for further evaluation. Symptoms started this morning, and to date she has had for episodes of bright red bloody stools. She takes daily ASA. She denies any abdominal pain, nausea, vomiting, or diarrhea.she denies any bloating. She did have decreased appetite over the last 6 weeks, having lost unknown amount of weight. She does report fatigue. She denies any chest pain or shortness of breath. She denies any myalgias, headaches or vision changes She denies any fever, chills, night sweats. . Of note, due to her history of dementia, we are unable to assess further details of her prior fall; she was found on the floor after 4 hours. ( For further details please refer to the HNP on 01/01/2016)  Prior to this episode, she denied any dark stools. In review, she has a history of colon polyps dating back to 2006,with one segment of hyperplastic polyp. No family history of colon cancer. Denies Diabetes. She smokes, denies ETOH. Denies prior radiation.  Denies family history of colon cancer. She is up to date with meds. No recent food poisoning or sick contacts. Lab work includes CBC remarkable for hemoglobin of 10.7, at baseline, white count 15.5, similar to prior labs. MCV is normal, indicating blood loss.creatinine 1.2 which is baseline, rest of CMET normal.  Repeat H and H are pending. Pulse 96  Temp(Src) 97.9 F (36.6 C) (Oral)  Resp 20  Ht 5\' 5"  (1.651 m)  Wt 62.324 kg (137 lb 6.4 oz)  BMI 22.86 kg/m2  SpO2 96%   Review of Systems:  See HPI for significant positives. All other systems were reviewed and are negative.  Past Medical History  Diagnosis Date  . Hypertension   . Hearing loss   . Vitamin D deficiency   . Chronic low back pain    Past Surgical History  Procedure Laterality Date  . Appendectomy    . Cholecystectomy    . Abdominal hysterectomy      Social History:  reports that she has never smoked. She does not have any smokeless tobacco history on file. She reports that she does not drink alcohol or use illicit drugs. lives in nursing home. 2 children in good health  No Known Allergies  Family History  Problem Relation Age of Onset  . Hypertension       Prior to Admission medications   Medication Sig Start Date End Date Taking? Authorizing Provider  aspirin 81 MG tablet Take 81 mg by mouth daily.    Historical Provider, MD  Multiple Vitamin (MULTIVITAMIN) tablet Take 1 tablet by mouth daily.    Historical Provider, MD  oxyCODONE (OXY IR/ROXICODONE) 5 MG immediate release tablet Take 1 tablet (5 mg total) by mouth every 4 (four) hours as needed for moderate pain. 01/02/16   Geradine Girt, DO   Physical Exam: Filed Vitals:   01/21/16 1336  Pulse: 96  Temp: 97.9 F (36.6 C)  TempSrc: Oral  Resp: 20  Height: 5\' 5"  (1.651 m)  Weight: 62.324 kg (137 lb 6.4 oz)  SpO2: 96%    Wt Readings from Last 3 Encounters:  01/21/16 62.324 kg (137 lb 6.4 oz)  01/02/16 66.5 kg (146 lb 9.7 oz)  03/30/15 74.481 kg (164 lb 3.2 oz)    General: Appears calm. Pleasantly confused Eyes:  PERRL, EOMI, normal lids, iris ENT: grossly normal hearing, lips & tongue Neck: no lymphadenopathy, masses or thyromegaly Cardiovascular: regular rate and rythm, no murmurs, rubs or gallops. No lower extremity edema   Respiratory: clear  to auscultation bilaterally, no wheezing, rhonchi or rales. Normal respiratory effort. Abdomen: soft, mild LLQ tenderness without masses, normal bowel sounds Skin: no rash or induration seen on limited exam. No open lesions. Musculoskeletal:  grossly normal tone in both upper and lower extremities Psychiatric: grossly normal mood and affect, speech fluent and appropriate Neurologic: CN 2-12 grossly intact, moves all extremities in coordinated fashion.          Labs on Admission:  Basic Metabolic Panel: No results for input(s): NA, K, CL, CO2, GLUCOSE, BUN, CREATININE, CALCIUM, MG, PHOS in the last 168 hours.   CBC: No results for input(s): WBC, NEUTROABS, HGB, HCT, MCV, PLT in the last 168 hours.   Radiological Exams on Admission: No results found.  EKG: Independently reviewed.   [facility] [place], [place] [address] [phone]  REPORT OF SURGICAL PATHOLOGY  Case #: [id number] Patient Name: Marks, Tracey A. PID: [id number] Pathologist: Janae Bridgeman L. [place] Circle, MD DOB/Age Feb 07, 1928 (Age: 76) Gender: F Date Taken: 01/11/2005 Date Received: 01/11/2005  FINAL DIAGNOSIS    ASCENDING COLON POLYPS: - TWO FRAGMENTS OF ADENOMATOUS POLYP, NEGATIVE FOR HIGH GRADE DYSPLASIA OR MALIGNANCY. - ONE FRAGMENT OF HYPERPLASTIC POLYP.          Assessment/Plan Principal Problem:   Rectal bleeding Active Problems:   Leucocytosis   HTN (hypertension)   Acute blood loss anemia   Anemia, chronic disease   Hearing loss   Rectal bleed   Rectal Bleeding, this is acute, no prior events. Last colonoscopy in April 2006, showing ascending colon polyps, benign, in one fragment of hyperplastic polyp.Patient had 4 events of significant blood loss through the rectum. We will recheck H&H. The patient may need a GI consultation for possible colonoscopy. Further recommendations may include CT of the abdomen and pelvis pending on the colonoscopy  results. Hold ASA and other anticoagulants due to GIB  Symptomatic Anemia, chronic in the setting of blood loss, dilution and decreased oral intake Hb on admission 10.7 at baseline, new H/H pending Last Iron and B12 panel in 12/2015 were unremarkable May need transfusion to  maintain a Hb of 8 g or if the patient is acutely bleeding Will monitor, may need GI consult and Heme consult if labs continue to worsen Serial CBC   Leukocytosis, chronic, current WBC 15.5 , likely with a reactive component  This is followed by Heme, Dr. Alen Blew No intervention is indicated at this time    Hypertension not recorded at this time She is not on meds Add hydralazine and clonidine when necessary with parameters Will continue to monitor  DVT prophylaxis On SCD's due to rectal bleeding   Deconditioning  PT/OT/Nutrition Evaluation  Code Status: Full Code  DVT Prophylaxis: SCDs Family Communication:  Son at bedside Disposition Plan: Pending Improvement. Admitted for observation in tele bed. Expected LOS 24-48 hrs  [facility] E,PA-C [facility] [email or web address] Password [email or web address]

## 2016-01-21 NOTE — Progress Notes (Addendum)
Tracey Marks [id number] Admission Data: 01/21/2016 1:36 PM Attending Provider: Elwin Mocha, MD  [id number] A, MD Consults/ Treatment Team:    Tracey Marks is a 82 y.o. female patient admitted from [facility] awake, alert  & orientated to self, HOH,  Prior, No c/o shortness of breath, no c/o chest pain, no distress noted.  IV site WDL:  SL at this time.   Allergies:  No Known Allergies   Past Medical History  Diagnosis Date  . Hypertension   . Hearing loss   . Vitamin D deficiency   . Chronic low back pain     Current Smoker.   Pt orientation to unit, room and routine. Information packet given to patient/family and safety video watched.  Admission INP armband ID verified with patient/family, and in place. SR up x 2, fall risk assessment complete with Patient and family verbalizing understanding of risks associated with falls. Pt verbalizes an understanding of how to use the call bell and to call for help before getting out of bed.  Skin, clean-dry- intact without any skin tears, some bruising noted to legs.   No evidence of skin break down noted on exam.    Will cont to monitor and assist as needed.  Tracey Points, RN 01/21/2016 1:36 PM

## 2016-01-22 DIAGNOSIS — K625 Hemorrhage of anus and rectum: Secondary | ICD-10-CM | POA: Diagnosis not present

## 2016-01-22 DIAGNOSIS — D638 Anemia in other chronic diseases classified elsewhere: Secondary | ICD-10-CM

## 2016-01-22 DIAGNOSIS — I10 Essential (primary) hypertension: Secondary | ICD-10-CM

## 2016-01-22 DIAGNOSIS — D72829 Elevated white blood cell count, unspecified: Secondary | ICD-10-CM | POA: Diagnosis not present

## 2016-01-22 LAB — COMPREHENSIVE METABOLIC PANEL
ALBUMIN: 2.6 g/dL — AB (ref 3.5–5.0)
ALT: 17 U/L (ref 14–54)
AST: 19 U/L (ref 15–41)
Alkaline Phosphatase: 103 U/L (ref 38–126)
Anion gap: 10 (ref 5–15)
BUN: 30 mg/dL — AB (ref 6–20)
CHLORIDE: 110 mmol/L (ref 101–111)
CO2: 21 mmol/L — AB (ref 22–32)
Calcium: 8.1 mg/dL — ABNORMAL LOW (ref 8.9–10.3)
Creatinine, Ser: 1.33 mg/dL — ABNORMAL HIGH (ref 0.44–1.00)
GFR calc Af Amer: 41 mL/min — ABNORMAL LOW (ref >60)
GFR calc non Af Amer: 35 mL/min — ABNORMAL LOW (ref >60)
GLUCOSE: 80 mg/dL (ref 65–99)
POTASSIUM: 3.5 mmol/L (ref 3.5–5.1)
SODIUM: 141 mmol/L (ref 135–145)
Total Bilirubin: <0.1 mg/dL — ABNORMAL LOW (ref 0.3–1.2)
Total Protein: 5.2 g/dL — ABNORMAL LOW (ref 6.5–8.1)

## 2016-01-22 LAB — CBC
HEMATOCRIT: 22.8 % — AB (ref 36.0–46.0)
Hemoglobin: 7.3 g/dL — ABNORMAL LOW (ref 12.0–15.0)
MCH: 31.1 pg (ref 26.0–34.0)
MCHC: 32 g/dL (ref 30.0–36.0)
MCV: 97 fL (ref 78.0–100.0)
Platelets: 338 10*3/uL (ref 150–400)
RBC: 2.35 MIL/uL — ABNORMAL LOW (ref 3.87–5.11)
RDW: 17.1 % — AB (ref 11.5–15.5)
WBC: 10.3 10*3/uL (ref 4.0–10.5)

## 2016-01-22 LAB — HEMOGLOBIN AND HEMATOCRIT, BLOOD
HEMATOCRIT: 25.5 % — AB (ref 36.0–46.0)
HEMOGLOBIN: 8.3 g/dL — AB (ref 12.0–15.0)

## 2016-01-22 LAB — PREPARE RBC (CROSSMATCH)

## 2016-01-22 LAB — ABO/RH: ABO/RH(D): A POS

## 2016-01-22 MED ORDER — SODIUM CHLORIDE 0.9 % IV SOLN: Freq: Once | INTRAVENOUS | Status: DC

## 2016-01-22 NOTE — Progress Notes (Signed)
PROGRESS NOTE    Tracey Marks  [id number] DOB: 10-19-1927 DOA: 01/21/2016 PCP: Lilian Coma, MD  Outpatient Specialists:     Brief Narrative:  82 y.o. female with a PMH remarkable for anemia, mild leukocytosis followed by Hematology, HTN, HOH, dementia, recently admitted on 3/26 through 3/27 after sustaining a fall due to a presumed syncopal episode, at which time testing was inconclusive, now brought by EMS for evaluation of rectal bleeding. She was transferred from [facility], after she presented with an episode of heavy rectal bleeding yielding a large clot. Symptoms did not subside, she was brought here for further evaluation   Assessment & Plan:   Principal Problem:   Rectal bleeding Active Problems:   Leucocytosis   HTN (hypertension)   Acute blood loss anemia   Anemia, chronic disease   Hearing loss   Rectal bleed   Rectal Bleeding, this is acute, no prior events. Last colonoscopy in April 2006, showing ascending colon polyps, benign, in one fragment of hyperplastic polyp.Patient had 4 events of significant blood loss through the rectum. -Hold ASA and other anticoagulants due to concerns of GIB -Discussed case with daughter-in-Fuhs over phone -Symptoms are very suggestive of diverticular bleed that seems to have stopped -Discussed case with GI who agrees. Recs to cont to monitor and if recurrent gross bleed, then obtain bleeding scan. If hgb drops w/o gross bleed, then call GI back  Symptomatic Anemia, chronic in the setting of blood loss, dilution and decreased oral intake -Hb on admission 10.7 at baseline, hgb trended down to 7 range, given 1 unit prbc -Last Iron and B12 panel in 12/2015 were unremarkable -Serial CBC   Leukocytosis, chronic, current WBC 15.5 , likely with a reactive component -This is followed by Heme, Dr. Alen Blew -No intervention is indicated at this time   Hypertension not recorded at this time -She is not on meds -Add hydralazine  and clonidine when necessary with parameters -Will continue to monitor  DVT prophylaxis -On SCD's   Deconditioning  -PT/OT/Nutrition Evaluation   DVT prophylaxis: SCD's Code Status: Full Family Communication: Discussed with POA over phone Disposition Plan: Possible d/c in 24-48hrs   Consultants:   Discussed case with [facility] over phone  Procedures:     Antimicrobials:      Subjective: No complaints. Currently baseline confused  Objective: Filed Vitals:   01/22/16 0500 01/22/16 0628 01/22/16 1534 01/22/16 1537  BP:  127/62 106/43 108/44  Pulse:  77 67 64  Temp:  98.4 F (36.9 C) 97.7 F (36.5 C)   TempSrc:   Oral   Resp:  16 18   Height:      Weight: 62.324 kg (137 lb 6.4 oz)     SpO2:  92% 98% 98%    Intake/Output Summary (Last 24 hours) at 01/22/16 1728 Last data filed at 01/22/16 0617  Gross per 24 hour  Intake    335 ml  Output      0 ml  Net    335 ml   Filed Weights   01/21/16 1336 01/22/16 0500  Weight: 62.324 kg (137 lb 6.4 oz) 62.324 kg (137 lb 6.4 oz)    Examination:  General exam: Appears calm and comfortable  Respiratory system: Clear to auscultation. Respiratory effort normal. Cardiovascular system: S1 & S2 heard, RRR. no pedal edema. Gastrointestinal system: Abdomen is nondistended, soft and nontender. No organomegaly or masses felt. Normal bowel sounds heard. Central nervous system: Alert and oriented. No focal neurological deficits. Extremities: Symmetric  5 x 5 power. Skin: No rashes, lesions or ulcers Psychiatry: Mood & affect appropriate.     Data Reviewed: I have personally reviewed following labs and imaging studies  CBC:  Recent Labs Lab 01/21/16 1439 01/21/16 1904 01/21/16 2247 01/22/16 0215  WBC  --   --   --  10.3  HGB 9.2* 8.5* 7.3* 7.3*  HCT 28.7* 25.3* 22.6* 22.8*  MCV  --   --   --  97.0  PLT  --   --   --  [id number]   Basic Metabolic Panel:  Recent Labs Lab 01/22/16 0215  NA 141  K 3.5  CL 110    CO2 21*  GLUCOSE 80  BUN 30*  CREATININE 1.33*  CALCIUM 8.1*   GFR: Estimated Creatinine Clearance: 27.8 mL/min (by C-G formula based on Cr of 1.33). Liver Function Tests:  Recent Labs Lab 01/22/16 0215  AST 19  ALT 17  ALKPHOS 103  BILITOT <0.1*  PROT 5.2*  ALBUMIN 2.6*   No results for input(s): LIPASE, AMYLASE in the last 168 hours. No results for input(s): AMMONIA in the last 168 hours. Coagulation Profile: No results for input(s): INR, PROTIME in the last 168 hours. Cardiac Enzymes: No results for input(s): CKTOTAL, CKMB, CKMBINDEX, TROPONINI in the last 168 hours. BNP (last 3 results) No results for input(s): PROBNP in the last 8760 hours. HbA1C: No results for input(s): HGBA1C in the last 72 hours. CBG: No results for input(s): GLUCAP in the last 168 hours. Lipid Profile: No results for input(s): CHOL, HDL, LDLCALC, TRIG, CHOLHDL, LDLDIRECT in the last 72 hours. Thyroid Function Tests: No results for input(s): TSH, T4TOTAL, FREET4, T3FREE, THYROIDAB in the last 72 hours. Anemia Panel: No results for input(s): VITAMINB12, FOLATE, FERRITIN, TIBC, IRON, RETICCTPCT in the last 72 hours. Urine analysis:    Component Value Date/Time   COLORURINE YELLOW 01/01/2016 [place] 01/01/2016 1417   LABSPEC 1.016 01/01/2016 1417   PHURINE 7.0 01/01/2016 1417   GLUCOSEU NEGATIVE 01/01/2016 1417   HGBUR NEGATIVE 01/01/2016 1417   BILIRUBINUR NEGATIVE 01/01/2016 1417   KETONESUR NEGATIVE 01/01/2016 1417   PROTEINUR NEGATIVE 01/01/2016 1417   NITRITE NEGATIVE 01/01/2016 1417   LEUKOCYTESUR NEGATIVE 01/01/2016 1417   Sepsis Labs: @LABRCNTIP (procalcitonin:4,lacticidven:4)  ) Recent Results (from the past 240 hour(s))  MRSA PCR Screening     Status: None   Collection Time: 01/21/16  3:56 PM  Result Value Ref Range Status   MRSA by PCR NEGATIVE NEGATIVE Final    Comment:        The GeneXpert MRSA Assay (FDA approved for NASAL specimens only), is one  component of a comprehensive MRSA colonization surveillance program. It is not intended to diagnose MRSA infection nor to guide or monitor treatment for MRSA infections.          Radiology Studies: No results found.      Scheduled Meds: . sodium chloride   Intravenous Once   Continuous Infusions:    LOS: 1 day     Shulamis Wenberg, Orpah Melter, MD [facility] Pager [phone]  If 7PM-7AM, please contact night-coverage [email or web address] Password [facility] 01/22/2016, 5:28 PM

## 2016-01-23 ENCOUNTER — Encounter (HOSPITAL_COMMUNITY): Payer: Self-pay

## 2016-01-23 DIAGNOSIS — D62 Acute posthemorrhagic anemia: Secondary | ICD-10-CM | POA: Diagnosis not present

## 2016-01-23 DIAGNOSIS — I10 Essential (primary) hypertension: Secondary | ICD-10-CM | POA: Diagnosis not present

## 2016-01-23 DIAGNOSIS — K625 Hemorrhage of anus and rectum: Secondary | ICD-10-CM | POA: Diagnosis not present

## 2016-01-23 LAB — CBC
HEMATOCRIT: 25.5 % — AB (ref 36.0–46.0)
HEMOGLOBIN: 8.4 g/dL — AB (ref 12.0–15.0)
MCH: 30.5 pg (ref 26.0–34.0)
MCHC: 32.9 g/dL (ref 30.0–36.0)
MCV: 92.7 fL (ref 78.0–100.0)
PLATELETS: 335 10*3/uL (ref 150–400)
RBC: 2.75 MIL/uL — ABNORMAL LOW (ref 3.87–5.11)
RDW: 18.1 % — AB (ref 11.5–15.5)
WBC: 11.6 10*3/uL — AB (ref 4.0–10.5)

## 2016-01-23 LAB — TYPE AND SCREEN
ABO/RH(D): A POS
Antibody Screen: NEGATIVE
Unit division: 0

## 2016-01-23 LAB — HEMOGLOBIN AND HEMATOCRIT, BLOOD
HEMATOCRIT: 26.9 % — AB (ref 36.0–46.0)
HEMOGLOBIN: 8.7 g/dL — AB (ref 12.0–15.0)

## 2016-01-23 NOTE — Progress Notes (Signed)
Pt had 4 BM last night. Smears of red loose stool noted every time. Will cont to monitor

## 2016-01-23 NOTE — Evaluation (Signed)
Occupational Therapy Evaluation Patient Details Name: Tracey Marks MRN: [id number] DOB: 1928-01-08 Today's Date: 01/23/2016    History of Present Illness Pt admitted with rectal bleeding. Recent admission to [facility] after falling due to apparent syncopal episode. PMH: anemia, leukocytosis, HTN, HOH, dementia   Clinical Impression   Pt ambulated with a cane, was independent in self care and cooking and was assisted by her 80 year old husband for housekeeping. She reports that she and her husband still drive. Pt presents with impaired cognition and safety awareness, L hip pain and decreased balance. Family wanted short term rehab following her recent admission, but pt was observation status and rehab not covered by insurance. Recommending physical therapy evaluation while admitted and [place]. Will follow.   Follow Up Recommendations  Home health OT;Supervision/Assistance - 24 hour    Equipment Recommendations   (defer to [facility])    Recommendations for Other Services PT consult     Precautions / Restrictions Precautions Precautions: Fall Restrictions Weight Bearing Restrictions: No      Mobility Bed Mobility               General bed mobility comments: pt in chair  Transfers Overall transfer level: Needs assistance Equipment used: 1 person hand held assist Transfers: Sit to/from Stand Sit to Stand: Supervision         General transfer comment: no physical assist to rise, hand held to ambulate due to cane not being in room    Balance Overall balance assessment: Needs assistance   Sitting balance-Leahy Scale: Good       Standing balance-Leahy Scale: Poor                              ADL Overall ADL's : Needs assistance/impaired Eating/Feeding: Independent;Sitting   Grooming: Brushing hair;Standing;Supervision/safety   Upper Body Bathing: Set up;Sitting   Lower Body Bathing: Supervison/ safety;Sit to/from stand   Upper Body Dressing : Set up;Sitting    Lower Body Dressing: Supervision/safety;Sit to/from stand   Toilet Transfer: Minimal assistance;Ambulation;Comfort height toilet Toilet Transfer Details (indicate cue type and reason): hand held assist in absence of cane Toileting- Clothing Manipulation and Hygiene: Supervision/safety;Sit to/from stand       Functional mobility during ADLs: Minimal assistance       Vision     Perception     Praxis      Pertinent Vitals/Pain Pain Assessment: Faces Faces Pain Scale: Hurts a little bit Pain Location: L hip Pain Descriptors / Indicators: Sore Pain Intervention(s): Monitored during session     Hand Dominance Right   Extremity/Trunk Assessment Upper Extremity Assessment Upper Extremity Assessment: Overall WFL for tasks assessed   Lower Extremity Assessment Lower Extremity Assessment: Generalized weakness LLE Deficits / Details: pain with ambulation--L hip   Cervical / Trunk Assessment Cervical / Trunk Assessment: Kyphotic   Communication Communication Communication: HOH   Cognition Arousal/Alertness: Awake/alert Behavior During Therapy: WFL for tasks assessed/performed Overall Cognitive Status: No family/caregiver present to determine baseline cognitive functioning Area of Impairment: Orientation;Memory;Safety/judgement Orientation Level: Disoriented to;Place;Time;Situation   Memory: Decreased short-term memory   Safety/Judgement: Decreased awareness of safety;Decreased awareness of deficits         General Comments       Exercises       Shoulder Instructions      Home Living Family/patient expects to be discharged to:: Private residence Living Arrangements: Spouse/significant other (spouse is 34, also uses a cane) Available Help at  Discharge: Family;Available 24 hours/day Type of Home: House Home Access: Stairs to enter [employer] of Steps: 3 Entrance Stairs-Rails: Left Home Layout: One level     Bathroom Shower/Tub: Arts administrator: Standard     Home Equipment: Cane - single point          Prior Functioning/Environment Level of Independence: Independent with assistive device(s)        Comments: uses cane    OT Diagnosis: Generalized weakness;Cognitive deficits;Acute pain   OT Problem List: Impaired balance (sitting and/or standing);Decreased cognition;Decreased safety awareness;Decreased knowledge of use of DME or AE;Pain   OT Treatment/Interventions: Self-care/ADL training;DME and/or AE instruction;Patient/family education;Balance training    OT Goals(Current goals can be found in the care plan section) Acute Rehab OT Goals Patient Stated Goal: Pt wants to go home. OT Goal Formulation: With patient Time For Goal Achievement: 01/30/16 Potential to Achieve Goals: Good ADL Goals Pt Will Perform Grooming: with modified independence;standing Pt Will Perform Lower Body Bathing: with modified independence;sit to/from stand Pt Will Perform Lower Body Dressing: with modified independence;sit to/from stand Pt Will Transfer to Toilet: with modified independence;ambulating;regular height toilet Pt Will Perform Toileting - Clothing Manipulation and hygiene: with modified independence;sit to/from stand Pt Will Perform Tub/Shower Transfer: Tub transfer;ambulating (determine need for shower seat)  OT Frequency: Min 2X/week   Barriers to D/C:            Co-evaluation              End of Session Equipment Utilized During Treatment: Gait belt  Activity Tolerance: Patient tolerated treatment well Patient left: in chair;with call bell/phone within reach;with chair alarm set   Time: 1010-1030 OT Time Calculation (min): 20 min Charges:  OT General Charges $OT Visit: 1 Procedure OT Evaluation $OT Eval Low Complexity: 1 Procedure G-Codes: OT G-codes **NOT FOR INPATIENT CLASS** Functional Assessment Tool Used: clinical judgement Functional Limitation: Self care Self Care Current  Status [id number]): At least 1 percent but less than 20 percent impaired, limited or restricted Self Care Goal Status [id number]): At least 1 percent but less than 20 percent impaired, limited or restricted Self Care Discharge Status [phone]): At least 1 percent but less than 20 percent impaired, limited or restricted  Malka So 01/23/2016, 10:42 AM  [phone]

## 2016-01-23 NOTE — Progress Notes (Signed)
PROGRESS NOTE    Tracey Marks  [id number] DOB: Jun 12, 1928 DOA: 01/21/2016 PCP: Lilian Coma, MD  Outpatient Specialists:     Brief Narrative:  82 y.o. female with a PMH remarkable for anemia, mild leukocytosis followed by Hematology, HTN, HOH, dementia, recently admitted on 3/26 through 3/27 after sustaining a fall due to a presumed syncopal episode, at which time testing was inconclusive, now brought by EMS for evaluation of rectal bleeding. She was transferred from [facility], after she presented with an episode of heavy rectal bleeding yielding a large clot. Symptoms did not subside, she was brought here for further evaluation   Assessment & Plan:   Principal Problem:   Rectal bleeding Active Problems:   Leucocytosis   HTN (hypertension)   Acute blood loss anemia   Anemia, chronic disease   Hearing loss   Rectal bleed   Rectal Bleeding, this is acute, no prior events. Last colonoscopy in April 2006, showing ascending colon polyps, benign, in one fragment of hyperplastic polyp.Patient had 4 events of significant blood loss through the rectum. -Hold ASA and other anticoagulants due to concerns of GIB -Discussed case with daughter-in-Eng over phone -Symptoms are very suggestive of diverticular bleed that seems to have stopped -Discussed case with GI who agrees. Recs to cont to monitor and if recurrent gross bleed, then obtain bleeding scan. If hgb drops w/o gross bleed, then call GI back -Hgb has remained stable over night. Will repeat h/h at 5:30pm today and repeat CBC in AM -If cbc remains stable, consider possible d/c in AM  Symptomatic Anemia, chronic in the setting of blood loss, dilution and decreased oral intake -Hb on admission 10.7 at baseline, hgb trended down to 7 range, given 1 unit prbc -Last Iron and B12 panel in 12/2015 were unremarkable -Serial CBC   Leukocytosis, chronic, current WBC 15.5 , likely with a reactive component -This is followed by  [facility], Dr. Alen Blew -No intervention is indicated at this time   Hypertension not recorded at this time -She is not on meds -Add hydralazine and clonidine when necessary with parameters -Will continue to monitor  DVT prophylaxis -On SCD's   Deconditioning  -PT/OT/Nutrition Evaluation   DVT prophylaxis: SCD's Code Status: Full Family Communication: Discussed with POA over phone Disposition Plan: Possible d/c home with HH/PT/OT in 24hrs if hgb stable   Consultants:   Discussed case with [facility] over phone  Procedures:     Antimicrobials:      Subjective: Mildly confused. Wants to go home  Objective: Filed Vitals:   01/23/16 0437 01/23/16 0440 01/23/16 0527 01/23/16 1448  BP:  104/44 128/54 136/57  Pulse:  63 60 65  Temp:  97.7 F (36.5 C) 97.7 F (36.5 C) 97.5 F (36.4 C)  TempSrc:  Oral    Resp:  16 18 17   Height:      Weight: 63.005 kg (138 lb 14.4 oz)     SpO2:  97% 99% 100%   No intake or output data in the 24 hours ending 01/23/16 1713 Filed Weights   01/21/16 1336 01/22/16 0500 01/23/16 0437  Weight: 62.324 kg (137 lb 6.4 oz) 62.324 kg (137 lb 6.4 oz) 63.005 kg (138 lb 14.4 oz)    Examination:  General exam: Appears calm and comfortable, sitting in chair Respiratory system: Clear to auscultation. Respiratory effort normal. Cardiovascular system: S1 & S2 heard, RRR. no pedal edema. Gastrointestinal system: Abdomen is nondistended, soft and nontender. No organomegaly or masses felt. Normal bowel sounds  heard. Central nervous system: No focal neurological deficits. Extremities: Symmetric 5 x 5 power. Skin: No rashes, lesions or ulcers Psychiatry: Mood & affect appropriate.     Data Reviewed: I have personally reviewed following labs and imaging studies  CBC:  Recent Labs Lab 01/21/16 2247 01/22/16 0215 01/22/16 1843 01/23/16 0548 01/23/16 1611  WBC  --  10.3  --  11.6*  --   HGB 7.3* 7.3* 8.3* 8.4* 8.7*  HCT 22.6* 22.8* 25.5*  25.5* 26.9*  MCV  --  97.0  --  92.7  --   PLT  --  338  --  335  --    Basic Metabolic Panel:  Recent Labs Lab 01/22/16 0215  NA 141  K 3.5  CL 110  CO2 21*  GLUCOSE 80  BUN 30*  CREATININE 1.33*  CALCIUM 8.1*   GFR: Estimated Creatinine Clearance: 27.8 mL/min (by C-G formula based on Cr of 1.33). Liver Function Tests:  Recent Labs Lab 01/22/16 0215  AST 19  ALT 17  ALKPHOS 103  BILITOT <0.1*  PROT 5.2*  ALBUMIN 2.6*   No results for input(s): LIPASE, AMYLASE in the last 168 hours. No results for input(s): AMMONIA in the last 168 hours. Coagulation Profile: No results for input(s): INR, PROTIME in the last 168 hours. Cardiac Enzymes: No results for input(s): CKTOTAL, CKMB, CKMBINDEX, TROPONINI in the last 168 hours. BNP (last 3 results) No results for input(s): PROBNP in the last 8760 hours. HbA1C: No results for input(s): HGBA1C in the last 72 hours. CBG: No results for input(s): GLUCAP in the last 168 hours. Lipid Profile: No results for input(s): CHOL, HDL, LDLCALC, TRIG, CHOLHDL, LDLDIRECT in the last 72 hours. Thyroid Function Tests: No results for input(s): TSH, T4TOTAL, FREET4, T3FREE, THYROIDAB in the last 72 hours. Anemia Panel: No results for input(s): VITAMINB12, FOLATE, FERRITIN, TIBC, IRON, RETICCTPCT in the last 72 hours. Urine analysis:    Component Value Date/Time   COLORURINE YELLOW 01/01/2016 [place] 01/01/2016 1417   LABSPEC 1.016 01/01/2016 1417   PHURINE 7.0 01/01/2016 1417   GLUCOSEU NEGATIVE 01/01/2016 1417   HGBUR NEGATIVE 01/01/2016 1417   BILIRUBINUR NEGATIVE 01/01/2016 1417   KETONESUR NEGATIVE 01/01/2016 1417   PROTEINUR NEGATIVE 01/01/2016 1417   NITRITE NEGATIVE 01/01/2016 1417   LEUKOCYTESUR NEGATIVE 01/01/2016 1417   Sepsis Labs: @LABRCNTIP (procalcitonin:4,lacticidven:4)  ) Recent Results (from the past 240 hour(s))  MRSA PCR Screening     Status: None   Collection Time: 01/21/16  3:56 PM    Result Value Ref Range Status   MRSA by PCR NEGATIVE NEGATIVE Final    Comment:        The GeneXpert MRSA Assay (FDA approved for NASAL specimens only), is one component of a comprehensive MRSA colonization surveillance program. It is not intended to diagnose MRSA infection nor to guide or monitor treatment for MRSA infections.          Radiology Studies: No results found.      Scheduled Meds: . sodium chloride   Intravenous Once   Continuous Infusions:    LOS: 2 days     Amaar Oshita, Orpah Melter, MD [facility] Pager [phone]  If 7PM-7AM, please contact night-coverage [email or web address] Password [email or web address] 01/23/2016, 5:13 PM

## 2016-01-24 DIAGNOSIS — K625 Hemorrhage of anus and rectum: Secondary | ICD-10-CM | POA: Diagnosis not present

## 2016-01-24 DIAGNOSIS — I10 Essential (primary) hypertension: Secondary | ICD-10-CM | POA: Diagnosis not present

## 2016-01-24 DIAGNOSIS — D638 Anemia in other chronic diseases classified elsewhere: Secondary | ICD-10-CM | POA: Diagnosis not present

## 2016-01-24 LAB — CBC
HEMATOCRIT: 25.1 % — AB (ref 36.0–46.0)
HEMOGLOBIN: 8.3 g/dL — AB (ref 12.0–15.0)
MCH: 31.1 pg (ref 26.0–34.0)
MCHC: 33.1 g/dL (ref 30.0–36.0)
MCV: 94 fL (ref 78.0–100.0)
Platelets: 370 10*3/uL (ref 150–400)
RBC: 2.67 MIL/uL — AB (ref 3.87–5.11)
RDW: 17.5 % — ABNORMAL HIGH (ref 11.5–15.5)
WBC: 10.3 10*3/uL (ref 4.0–10.5)

## 2016-01-24 NOTE — Progress Notes (Signed)
Report given to Lattie Haw, Nurse at [facility]

## 2016-01-24 NOTE — Progress Notes (Signed)
Tracey Marks to be D/C'd Nursing Home per MD order.  Discussed with the patient and all questions fully answered.  VSS, Skin clean, dry and intact without evidence of skin break down, no evidence of skin tears noted. IV catheter discontinued intact. Site without signs and symptoms of complications. Dressing and pressure applied.  An After Visit Summary was printed and given to the patient. Patient received prescription.  D/c education completed with patient/family including follow up instructions, medication list, d/c activities limitations if indicated, with other d/c instructions as indicated by MD - patient able to verbalize understanding, all questions fully answered.   Patient instructed to return to ED, call 911, or call MD for any changes in condition.   Patient to be escorted via WC, and D/C to [place] via private auto.  L'ESPERANCE, Sabatino Williard C 01/24/2016 10:58 AM

## 2016-01-24 NOTE — Progress Notes (Signed)
Occupational Therapy Treatment Patient Details Name: Tracey Marks MRN: [id number] DOB: Jul 11, 1928 Today's Date: 01/24/2016    History of present illness Pt admitted with rectal bleeding. Recent admission to [facility] after falling due to apparent syncopal episode. PMH: anemia, leukocytosis, HTN, HOH, dementia   OT comments  Pt performed bathing and dressing with supervision sitting and standing from chair in front of sink. Had bowel movement x 2 without visible blood, able to manage clothing and pericare with supervision. Continues to demonstrate poor memory and safety awareness. Needs close 24 hour supervision. Pt is eager to go home.  Follow Up Recommendations  Home health OT;Supervision/Assistance - 24 hour    Equipment Recommendations  3 in 1 bedside comode    Recommendations for Other Services      Precautions / Restrictions Precautions Precautions: Fall Restrictions Weight Bearing Restrictions: No       Mobility Bed Mobility               General bed mobility comments: pt in chair  Transfers Overall transfer level: Needs assistance Equipment used: Rolling walker (2 wheeled) Transfers: Sit to/from Stand Sit to Stand: Supervision;Min guard         General transfer comment: slower to rise compared to previous session, cues for hand placement for walker use, did not require physical assist    Balance     Sitting balance-Leahy Scale: Good       Standing balance-Leahy Scale: Poor                     ADL Overall ADL's : Needs assistance/impaired     Grooming: Brushing hair;Wash/dry hands;Supervision/safety;Standing   Upper Body Bathing: Set up;Sitting   Lower Body Bathing: Supervison/ safety;Sit to/from stand   Upper Body Dressing : Set up;Sitting   Lower Body Dressing: Supervision/safety;Sit to/from stand   Toilet Transfer: Supervision/safety;Ambulation;RW;BSC (BSC over toilet)   Toileting- Clothing Manipulation and Hygiene:  Supervision/safety;Sit to/from stand Toileting - Clothing Manipulation Details (indicate cue type and reason): able to perform pericare in sitting and manage mesh panties in standing     Functional mobility during ADLs: Supervision/safety;Rolling walker        Vision                     Perception     Praxis      Cognition   Behavior During Therapy: [facility] for tasks assessed/performed Overall Cognitive Status: No family/caregiver present to determine baseline cognitive functioning Area of Impairment: Orientation;Memory;Safety/judgement Orientation Level: Disoriented to;Place;Time   Memory: Decreased short-term memory    Safety/Judgement: Decreased awareness of safety;Decreased awareness of deficits     General Comments: pt not able to locate bathroom with second toileting bout    Extremity/Trunk Assessment               Exercises     Shoulder Instructions       General Comments      Pertinent Vitals/ Pain       Pain Assessment: No/denies pain  Home Living                                          Prior Functioning/Environment              Frequency Min 2X/week     Progress Toward Goals  OT Goals(current goals can now be found in the care  plan section)  Progress towards OT goals: Progressing toward goals  Acute Rehab OT Goals Patient Stated Goal: Pt wants to go home. Potential to Achieve Goals: Good  Plan Discharge plan remains appropriate    Co-evaluation                 End of Session Equipment Utilized During Treatment: Rolling walker   Activity Tolerance Patient tolerated treatment well   Patient Left in chair;with call bell/phone within reach;with chair alarm set   Nurse Communication          Time: [phone] OT Time Calculation (min): 35 min  Charges: OT General Charges $OT Visit: 1 Procedure OT Treatments $Self Care/Home Management : 23-37 mins  Malka So 01/24/2016, 10:19 AM   [phone]

## 2016-01-24 NOTE — Discharge Summary (Signed)
Physician Discharge Summary  Tracey Marks [id number] DOB: 09-03-1928 DOA: 01/21/2016  PCP: Lilian Coma, MD  Admit date: 01/21/2016 Discharge date: 01/24/2016  Time spent: 20 minutes  Recommendations for Outpatient Follow-up:  1. Follow up with PCP in 2-3 weeks   Discharge Diagnoses:  Principal Problem:   Rectal bleeding Active Problems:   Leucocytosis   HTN (hypertension)   Acute blood loss anemia   Anemia, chronic disease   Hearing loss   Rectal bleed   Discharge Condition: Stable  Diet recommendation: Regular  Filed Weights   01/22/16 0500 01/23/16 0437 01/24/16 0719  Weight: 62.324 kg (137 lb 6.4 oz) 63.005 kg (138 lb 14.4 oz) 59.92 kg (132 lb 1.6 oz)    History of present illness:  Please review dictated H and P from 4/15 for details. Briefly, 82 y.o. Marks with a PMH remarkable for anemia, mild leukocytosis followed by Hematology, HTN, HOH, dementia, recently admitted on 3/26 through 3/27 after sustaining a fall due to a presumed syncopal episode, at which time testing was inconclusive, now brought by EMS for evaluation of rectal bleeding. She was transferred from [facility], after she presented with an episode of heavy rectal bleeding yielding a large clot. Symptoms did not subside, she was brought here for further evaluation  Hospital Course:  Rectal Bleeding, this is acute, no prior events. Last colonoscopy in April 2006, showing ascending colon polyps, benign, in one fragment of hyperplastic polyp.Patient had 4 events of significant blood loss through the rectum. -Held ASA and other anticoagulants due to concerns of GIB -Discussed case with daughter-in-[place] over phone -Symptoms are very suggestive of diverticular bleed that seems to have stopped -Discussed case with GI who agrees with above. Recs to cont to monitor and if recurrent gross bleed, then obtain bleeding scan. If hgb drops w/o gross bleed, then call GI back -Hgb has remained stable over 24hrs  following one unit PRBC transfusion.  Symptomatic Anemia, chronic in the setting of blood loss, dilution and decreased oral intake -Hb on admission 10.7 at baseline, hgb trended down to 7 range, given 1 unit prbc -Last Iron and B12 panel in 12/2015 were unremarkable -Serial CBC  Leukocytosis, chronic, current WBC 15.5 , likely with a reactive component -This is followed by [facility], Dr. Alen Blew -No intervention is indicated at this time  Hypertension not recorded at this time -She is not on meds -Add hydralazine and clonidine when necessary with parameters -Will continue to monitor  DVT prophylaxis -On SCD's  Deconditioning  -PT/OT/Nutrition Evaluation  Consultations:  Discussed case with Dr. Watt Climes [facility])  Discharge Exam: Filed Vitals:   01/23/16 1448 01/23/16 2210 01/24/16 0624 01/24/16 0719  BP: 136/57 125/68 113/49   Pulse: 65 68 61   Temp: 97.5 F (36.4 C) 97.8 F (36.6 C) 98.3 F (36.8 C)   TempSrc:  Oral    Resp: 17 16 16    Height:      Weight:    59.92 kg (132 lb 1.6 oz)  SpO2: 100% 97% 98%     General: Awake, in nad Cardiovascular: regular, s1, s2 Respiratory: normal resp effort, no wheezing  Discharge Instructions     Medication List    TAKE these medications        aspirin 81 MG tablet  Take 81 mg by mouth daily.     cholecalciferol 1000 units tablet  Commonly known as:  VITAMIN D  Take 1,000 Units by mouth daily.     multivitamin tablet  Take 1  tablet by mouth daily.     oxyCODONE 5 MG immediate release tablet  Commonly known as:  Oxy IR/ROXICODONE  Take 1 tablet (5 mg total) by mouth every 4 (four) hours as needed for moderate pain.     traMADol 50 MG tablet  Commonly known as:  ULTRAM  Take 50 mg by mouth every 6 (six) hours as needed.       No Known Allergies Follow-up Information    Follow up with Lilian Coma, MD. Schedule an appointment as soon as possible for a visit in 2 weeks.   Specialty:  Family Medicine   Why:   Hospital follow up   Contact information:   [address] [place] [address] [phone]        The results of significant diagnostics from this hospitalization (including imaging, microbiology, ancillary and laboratory) are listed below for reference.    Significant Diagnostic Studies: Dg Thoracic Spine 2 View  01/01/2016  CLINICAL DATA:  Fall, back pain EXAM: THORACIC SPINE 2 VIEWS COMPARISON:  None. FINDINGS: Normal thoracic kyphosis. No evidence of fracture or dislocation. Vertebral body heights are maintained. Mild multilevel degenerative changes. Visualized lungs are clear. IMPRESSION: No fracture or dislocation is seen. Electronically Signed   By: Julian Hy M.D.   On: 01/01/2016 13:17   Dg Lumbar Spine Complete  01/01/2016  CLINICAL DATA:  Fall, mid back pain EXAM: LUMBAR SPINE - COMPLETE 4+ VIEW COMPARISON:  None. FINDINGS: Five lumbar type vertebral bodies. Normal lumbar lordosis.  Mild lumbar levoscoliosis. No evidence of fracture or dislocation. Vertebral body heights are maintained. Mild multilevel degenerative changes. Visualized bony pelvis appears intact. Surgical clips in the right upper abdomen. IMPRESSION: No fracture or dislocation is seen. Mild degenerative changes with lumbar levoscoliosis. Electronically Signed   By: Julian Hy M.D.   On: 01/01/2016 13:16   Dg Chest Port 1 View  01/01/2016  CLINICAL DATA:  82 year old Marks who fell today.  Weakness. EXAM: PORTABLE CHEST 1 VIEW COMPARISON:  Thoracic spine radiographs from today reported separately. FINDINGS: Portable AP semi upright view at 1446 hours. Upper limits of normal to hyperinflated lung volumes. Normal cardiac size and mediastinal contours. Visualized tracheal air column is within normal limits. Occasional small calcified granulomas otherwise, when allowing for portable technique the lungs are clear. Advanced degenerative changes at the left shoulder. Calcified aortic  atherosclerosis. IMPRESSION: No acute cardiopulmonary abnormality. Electronically Signed   By: Genevie Ann M.D.   On: 01/01/2016 15:02   Dg Hip Unilat W Or W/o Pelvis 2-3 Views Left  01/01/2016  CLINICAL DATA:  Left hip/back pain, status post fall EXAM: DG HIP (WITH OR WITHOUT PELVIS) 2-3V LEFT COMPARISON:  None. FINDINGS: No fracture or dislocation is seen. Mild degenerative changes of the bilateral hips. Visualized bony pelvis appears intact. Moderate degenerative changes of the lower lumbar spine. IMPRESSION: No fracture or dislocation is seen. Electronically Signed   By: Julian Hy M.D.   On: 01/01/2016 13:15    Microbiology: Recent Results (from the past 240 hour(s))  MRSA PCR Screening     Status: None   Collection Time: 01/21/16  3:56 PM  Result Value Ref Range Status   MRSA by PCR NEGATIVE NEGATIVE Final    Comment:        The GeneXpert MRSA Assay (FDA approved for NASAL specimens only), is one component of a comprehensive MRSA colonization surveillance program. It is not intended to diagnose MRSA infection nor to guide or monitor treatment  for MRSA infections.      Labs: Basic Metabolic Panel:  Recent Labs Lab 01/22/16 0215  NA 141  K 3.5  CL 110  CO2 21*  GLUCOSE 80  BUN 30*  CREATININE 1.33*  CALCIUM 8.1*   Liver Function Tests:  Recent Labs Lab 01/22/16 0215  AST 19  ALT 17  ALKPHOS 103  BILITOT <0.1*  PROT 5.2*  ALBUMIN 2.6*   No results for input(s): LIPASE, AMYLASE in the last 168 hours. No results for input(s): AMMONIA in the last 168 hours. CBC:  Recent Labs Lab 01/22/16 0215 01/22/16 1843 01/23/16 0548 01/23/16 1611 01/24/16 0710  WBC 10.3  --  11.6*  --  10.3  HGB 7.3* 8.3* 8.4* 8.7* 8.3*  HCT 22.8* 25.5* 25.5* 26.Tracey* 25.1*  MCV 97.0  --  92.7  --  94.0  PLT 338  --  335  --  370   Cardiac Enzymes: No results for input(s): CKTOTAL, CKMB, CKMBINDEX, TROPONINI in the last 168 hours. BNP: BNP (last 3 results) No results for  input(s): BNP in the last 8760 hours.  ProBNP (last 3 results) No results for input(s): PROBNP in the last 8760 hours.  CBG: No results for input(s): GLUCAP in the last 168 hours.   Signed:  Soledad Budreau K  [facility] 01/24/2016, 10:07 AM

## 2016-01-27 DIAGNOSIS — D72829 Elevated white blood cell count, unspecified: Secondary | ICD-10-CM | POA: Diagnosis not present

## 2016-01-27 DIAGNOSIS — F0390 Unspecified dementia without behavioral disturbance: Secondary | ICD-10-CM | POA: Diagnosis not present

## 2016-01-27 DIAGNOSIS — M4306 Spondylolysis, lumbar region: Secondary | ICD-10-CM | POA: Diagnosis not present

## 2016-01-27 DIAGNOSIS — I10 Essential (primary) hypertension: Secondary | ICD-10-CM | POA: Diagnosis not present

## 2016-01-27 DIAGNOSIS — K625 Hemorrhage of anus and rectum: Secondary | ICD-10-CM | POA: Diagnosis not present

## 2016-01-27 DIAGNOSIS — H905 Unspecified sensorineural hearing loss: Secondary | ICD-10-CM | POA: Diagnosis not present

## 2016-01-27 DIAGNOSIS — M4304 Spondylolysis, thoracic region: Secondary | ICD-10-CM | POA: Diagnosis not present

## 2016-01-27 DIAGNOSIS — Z7982 Long term (current) use of aspirin: Secondary | ICD-10-CM | POA: Diagnosis not present

## 2016-01-27 DIAGNOSIS — M6281 Muscle weakness (generalized): Secondary | ICD-10-CM | POA: Diagnosis not present

## 2016-01-27 DIAGNOSIS — D649 Anemia, unspecified: Secondary | ICD-10-CM | POA: Diagnosis not present

## 2016-01-27 DIAGNOSIS — M169 Osteoarthritis of hip, unspecified: Secondary | ICD-10-CM | POA: Diagnosis not present

## 2016-01-30 DIAGNOSIS — M169 Osteoarthritis of hip, unspecified: Secondary | ICD-10-CM | POA: Diagnosis not present

## 2016-01-30 DIAGNOSIS — D649 Anemia, unspecified: Secondary | ICD-10-CM | POA: Diagnosis not present

## 2016-01-30 DIAGNOSIS — M6281 Muscle weakness (generalized): Secondary | ICD-10-CM | POA: Diagnosis not present

## 2016-01-30 DIAGNOSIS — K625 Hemorrhage of anus and rectum: Secondary | ICD-10-CM | POA: Diagnosis not present

## 2016-01-30 DIAGNOSIS — M4304 Spondylolysis, thoracic region: Secondary | ICD-10-CM | POA: Diagnosis not present

## 2016-01-30 DIAGNOSIS — D72829 Elevated white blood cell count, unspecified: Secondary | ICD-10-CM | POA: Diagnosis not present

## 2016-01-31 DIAGNOSIS — M6281 Muscle weakness (generalized): Secondary | ICD-10-CM | POA: Diagnosis not present

## 2016-01-31 DIAGNOSIS — M4304 Spondylolysis, thoracic region: Secondary | ICD-10-CM | POA: Diagnosis not present

## 2016-01-31 DIAGNOSIS — K625 Hemorrhage of anus and rectum: Secondary | ICD-10-CM | POA: Diagnosis not present

## 2016-01-31 DIAGNOSIS — D649 Anemia, unspecified: Secondary | ICD-10-CM | POA: Diagnosis not present

## 2016-01-31 DIAGNOSIS — M169 Osteoarthritis of hip, unspecified: Secondary | ICD-10-CM | POA: Diagnosis not present

## 2016-01-31 DIAGNOSIS — D72829 Elevated white blood cell count, unspecified: Secondary | ICD-10-CM | POA: Diagnosis not present

## 2016-02-03 DIAGNOSIS — D649 Anemia, unspecified: Secondary | ICD-10-CM | POA: Diagnosis not present

## 2016-02-03 DIAGNOSIS — M4304 Spondylolysis, thoracic region: Secondary | ICD-10-CM | POA: Diagnosis not present

## 2016-02-03 DIAGNOSIS — K625 Hemorrhage of anus and rectum: Secondary | ICD-10-CM | POA: Diagnosis not present

## 2016-02-03 DIAGNOSIS — M6281 Muscle weakness (generalized): Secondary | ICD-10-CM | POA: Diagnosis not present

## 2016-02-03 DIAGNOSIS — D72829 Elevated white blood cell count, unspecified: Secondary | ICD-10-CM | POA: Diagnosis not present

## 2016-02-03 DIAGNOSIS — M169 Osteoarthritis of hip, unspecified: Secondary | ICD-10-CM | POA: Diagnosis not present

## 2016-02-06 DIAGNOSIS — D649 Anemia, unspecified: Secondary | ICD-10-CM | POA: Diagnosis not present

## 2016-02-06 DIAGNOSIS — K625 Hemorrhage of anus and rectum: Secondary | ICD-10-CM | POA: Diagnosis not present

## 2016-02-06 DIAGNOSIS — M6281 Muscle weakness (generalized): Secondary | ICD-10-CM | POA: Diagnosis not present

## 2016-02-06 DIAGNOSIS — D72829 Elevated white blood cell count, unspecified: Secondary | ICD-10-CM | POA: Diagnosis not present

## 2016-02-06 DIAGNOSIS — M4304 Spondylolysis, thoracic region: Secondary | ICD-10-CM | POA: Diagnosis not present

## 2016-02-06 DIAGNOSIS — M169 Osteoarthritis of hip, unspecified: Secondary | ICD-10-CM | POA: Diagnosis not present

## 2016-02-07 DIAGNOSIS — D649 Anemia, unspecified: Secondary | ICD-10-CM | POA: Diagnosis not present

## 2016-02-07 DIAGNOSIS — M6281 Muscle weakness (generalized): Secondary | ICD-10-CM | POA: Diagnosis not present

## 2016-02-07 DIAGNOSIS — K625 Hemorrhage of anus and rectum: Secondary | ICD-10-CM | POA: Diagnosis not present

## 2016-02-07 DIAGNOSIS — D72829 Elevated white blood cell count, unspecified: Secondary | ICD-10-CM | POA: Diagnosis not present

## 2016-02-07 DIAGNOSIS — M169 Osteoarthritis of hip, unspecified: Secondary | ICD-10-CM | POA: Diagnosis not present

## 2016-02-07 DIAGNOSIS — M4304 Spondylolysis, thoracic region: Secondary | ICD-10-CM | POA: Diagnosis not present

## 2016-02-09 DIAGNOSIS — M6281 Muscle weakness (generalized): Secondary | ICD-10-CM | POA: Diagnosis not present

## 2016-02-09 DIAGNOSIS — K625 Hemorrhage of anus and rectum: Secondary | ICD-10-CM | POA: Diagnosis not present

## 2016-02-09 DIAGNOSIS — M4304 Spondylolysis, thoracic region: Secondary | ICD-10-CM | POA: Diagnosis not present

## 2016-02-09 DIAGNOSIS — M169 Osteoarthritis of hip, unspecified: Secondary | ICD-10-CM | POA: Diagnosis not present

## 2016-02-09 DIAGNOSIS — D72829 Elevated white blood cell count, unspecified: Secondary | ICD-10-CM | POA: Diagnosis not present

## 2016-02-09 DIAGNOSIS — D649 Anemia, unspecified: Secondary | ICD-10-CM | POA: Diagnosis not present

## 2016-02-10 DIAGNOSIS — M4304 Spondylolysis, thoracic region: Secondary | ICD-10-CM | POA: Diagnosis not present

## 2016-02-10 DIAGNOSIS — K625 Hemorrhage of anus and rectum: Secondary | ICD-10-CM | POA: Diagnosis not present

## 2016-02-10 DIAGNOSIS — D72829 Elevated white blood cell count, unspecified: Secondary | ICD-10-CM | POA: Diagnosis not present

## 2016-02-10 DIAGNOSIS — D649 Anemia, unspecified: Secondary | ICD-10-CM | POA: Diagnosis not present

## 2016-02-10 DIAGNOSIS — M169 Osteoarthritis of hip, unspecified: Secondary | ICD-10-CM | POA: Diagnosis not present

## 2016-02-10 DIAGNOSIS — M6281 Muscle weakness (generalized): Secondary | ICD-10-CM | POA: Diagnosis not present

## 2016-02-13 DIAGNOSIS — D72829 Elevated white blood cell count, unspecified: Secondary | ICD-10-CM | POA: Diagnosis not present

## 2016-02-13 DIAGNOSIS — M6281 Muscle weakness (generalized): Secondary | ICD-10-CM | POA: Diagnosis not present

## 2016-02-13 DIAGNOSIS — I10 Essential (primary) hypertension: Secondary | ICD-10-CM | POA: Diagnosis not present

## 2016-02-13 DIAGNOSIS — K625 Hemorrhage of anus and rectum: Secondary | ICD-10-CM | POA: Diagnosis not present

## 2016-02-13 DIAGNOSIS — M169 Osteoarthritis of hip, unspecified: Secondary | ICD-10-CM | POA: Diagnosis not present

## 2016-02-13 DIAGNOSIS — M4304 Spondylolysis, thoracic region: Secondary | ICD-10-CM | POA: Diagnosis not present

## 2016-02-13 DIAGNOSIS — D649 Anemia, unspecified: Secondary | ICD-10-CM | POA: Diagnosis not present

## 2016-02-13 DIAGNOSIS — K5711 Diverticulosis of small intestine without perforation or abscess with bleeding: Secondary | ICD-10-CM | POA: Diagnosis not present

## 2016-02-15 DIAGNOSIS — D649 Anemia, unspecified: Secondary | ICD-10-CM | POA: Diagnosis not present

## 2016-02-15 DIAGNOSIS — M169 Osteoarthritis of hip, unspecified: Secondary | ICD-10-CM | POA: Diagnosis not present

## 2016-02-15 DIAGNOSIS — D72829 Elevated white blood cell count, unspecified: Secondary | ICD-10-CM | POA: Diagnosis not present

## 2016-02-15 DIAGNOSIS — M4304 Spondylolysis, thoracic region: Secondary | ICD-10-CM | POA: Diagnosis not present

## 2016-02-15 DIAGNOSIS — M6281 Muscle weakness (generalized): Secondary | ICD-10-CM | POA: Diagnosis not present

## 2016-02-15 DIAGNOSIS — K625 Hemorrhage of anus and rectum: Secondary | ICD-10-CM | POA: Diagnosis not present

## 2016-02-17 DIAGNOSIS — D649 Anemia, unspecified: Secondary | ICD-10-CM | POA: Diagnosis not present

## 2016-02-17 DIAGNOSIS — K625 Hemorrhage of anus and rectum: Secondary | ICD-10-CM | POA: Diagnosis not present

## 2016-02-17 DIAGNOSIS — M6281 Muscle weakness (generalized): Secondary | ICD-10-CM | POA: Diagnosis not present

## 2016-02-17 DIAGNOSIS — D72829 Elevated white blood cell count, unspecified: Secondary | ICD-10-CM | POA: Diagnosis not present

## 2016-02-17 DIAGNOSIS — M169 Osteoarthritis of hip, unspecified: Secondary | ICD-10-CM | POA: Diagnosis not present

## 2016-02-17 DIAGNOSIS — M4304 Spondylolysis, thoracic region: Secondary | ICD-10-CM | POA: Diagnosis not present

## 2016-02-20 DIAGNOSIS — D649 Anemia, unspecified: Secondary | ICD-10-CM | POA: Diagnosis not present

## 2016-02-20 DIAGNOSIS — M6281 Muscle weakness (generalized): Secondary | ICD-10-CM | POA: Diagnosis not present

## 2016-02-20 DIAGNOSIS — K625 Hemorrhage of anus and rectum: Secondary | ICD-10-CM | POA: Diagnosis not present

## 2016-02-20 DIAGNOSIS — M169 Osteoarthritis of hip, unspecified: Secondary | ICD-10-CM | POA: Diagnosis not present

## 2016-02-20 DIAGNOSIS — D72829 Elevated white blood cell count, unspecified: Secondary | ICD-10-CM | POA: Diagnosis not present

## 2016-02-20 DIAGNOSIS — M4304 Spondylolysis, thoracic region: Secondary | ICD-10-CM | POA: Diagnosis not present

## 2016-03-09 DIAGNOSIS — H90A31 Mixed conductive and sensorineural hearing loss, unilateral, right ear with restricted hearing on the contralateral side: Secondary | ICD-10-CM | POA: Diagnosis not present

## 2016-03-09 DIAGNOSIS — H90A22 Sensorineural hearing loss, unilateral, left ear, with restricted hearing on the contralateral side: Secondary | ICD-10-CM | POA: Diagnosis not present

## 2016-03-26 DIAGNOSIS — Z79899 Other long term (current) drug therapy: Secondary | ICD-10-CM | POA: Diagnosis not present

## 2016-03-26 DIAGNOSIS — E782 Mixed hyperlipidemia: Secondary | ICD-10-CM | POA: Diagnosis not present

## 2016-03-26 DIAGNOSIS — D518 Other vitamin B12 deficiency anemias: Secondary | ICD-10-CM | POA: Diagnosis not present

## 2016-04-25 DIAGNOSIS — L309 Dermatitis, unspecified: Secondary | ICD-10-CM | POA: Diagnosis not present

## 2016-04-25 DIAGNOSIS — Z720 Tobacco use: Secondary | ICD-10-CM | POA: Diagnosis not present

## 2016-04-25 DIAGNOSIS — R54 Age-related physical debility: Secondary | ICD-10-CM | POA: Diagnosis not present

## 2016-04-25 DIAGNOSIS — R413 Other amnesia: Secondary | ICD-10-CM | POA: Diagnosis not present

## 2016-06-29 DIAGNOSIS — Z23 Encounter for immunization: Secondary | ICD-10-CM | POA: Diagnosis not present

## 2016-07-30 DIAGNOSIS — R030 Elevated blood-pressure reading, without diagnosis of hypertension: Secondary | ICD-10-CM | POA: Diagnosis not present

## 2016-08-11 IMAGING — CR DG LUMBAR SPINE COMPLETE 4+V
5 series · 5 of 5 positions shown · non-contrast
Comparison: None.

CLINICAL DATA: Fall, mid back pain

EXAM:
LUMBAR SPINE - COMPLETE 4+ VIEW

[t lumbar spine ap]
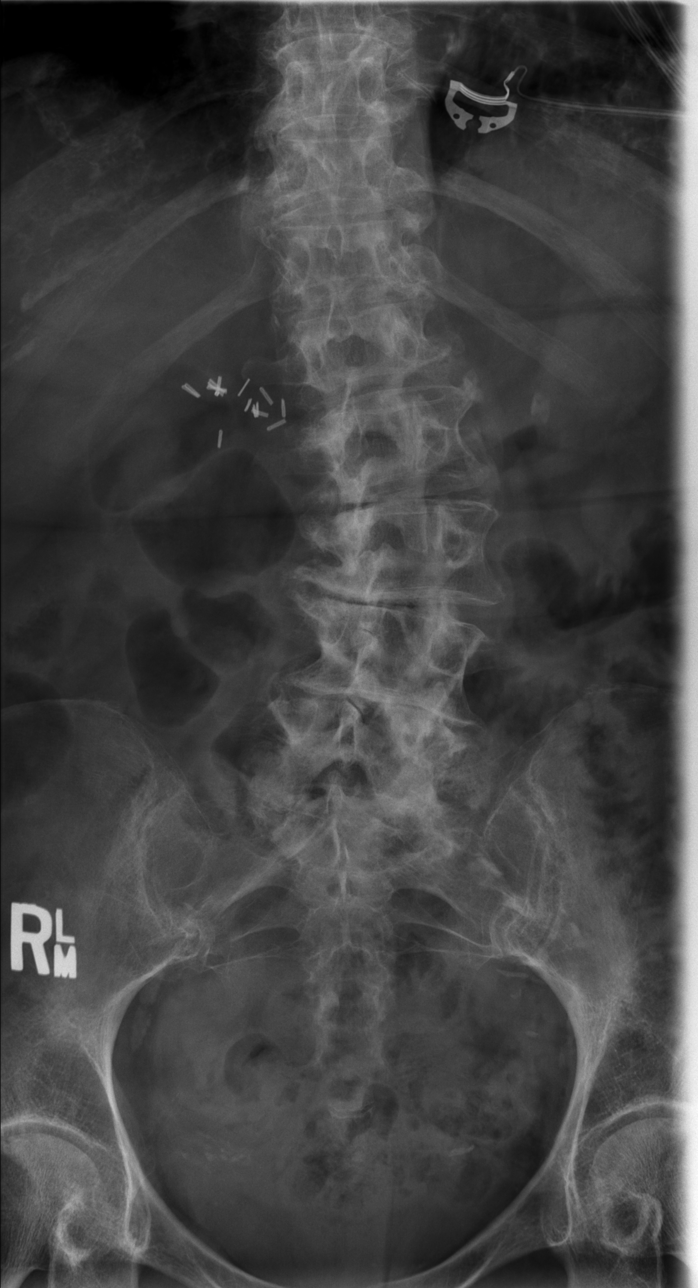

[t lumbar spine obl (1 of 2)]
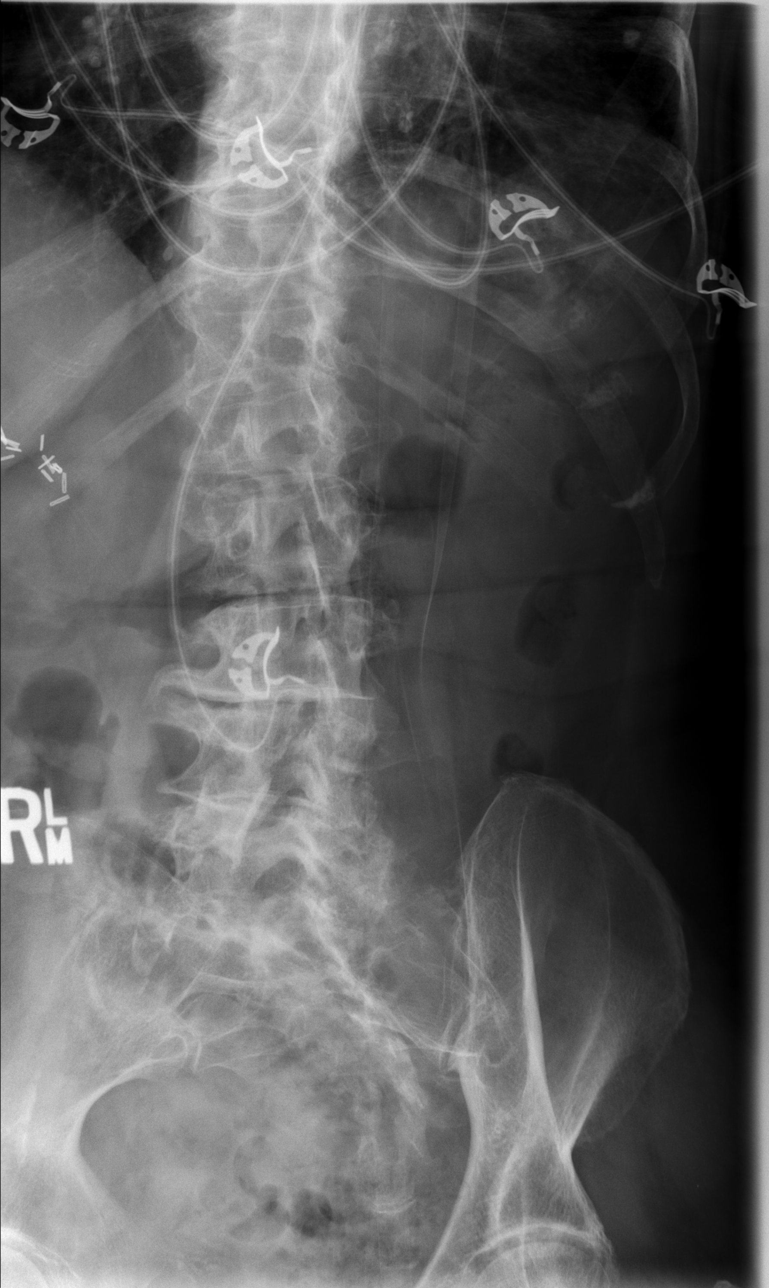

[t lumbar spine obl (2 of 2)]
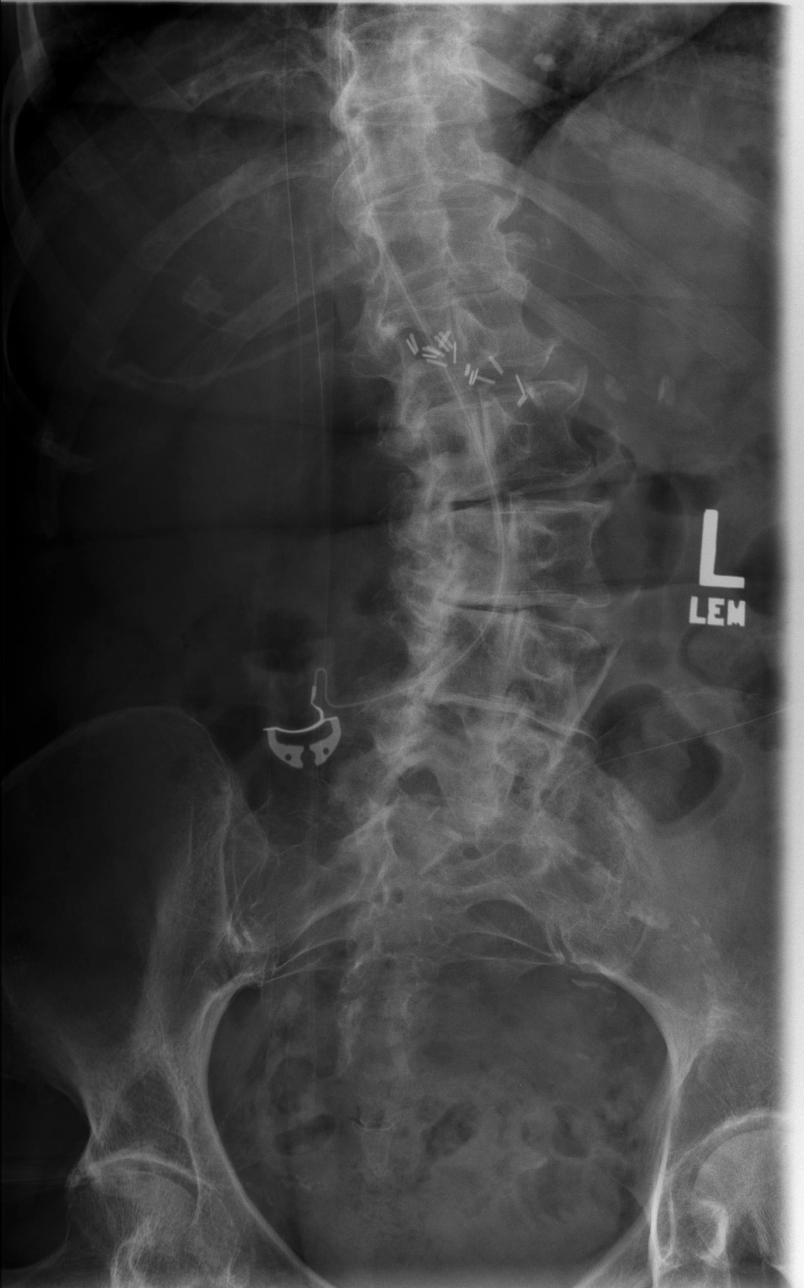

[t lumbar spine lat]
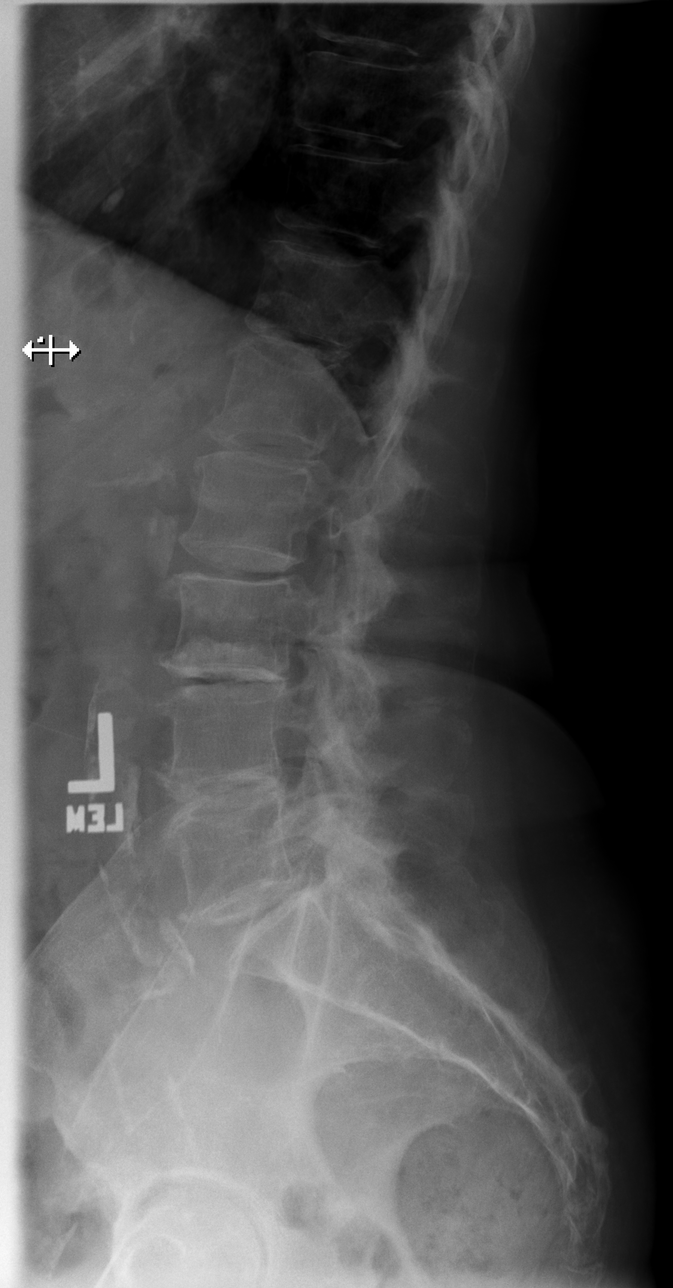

[t lumbar l-5 s-1 spot]
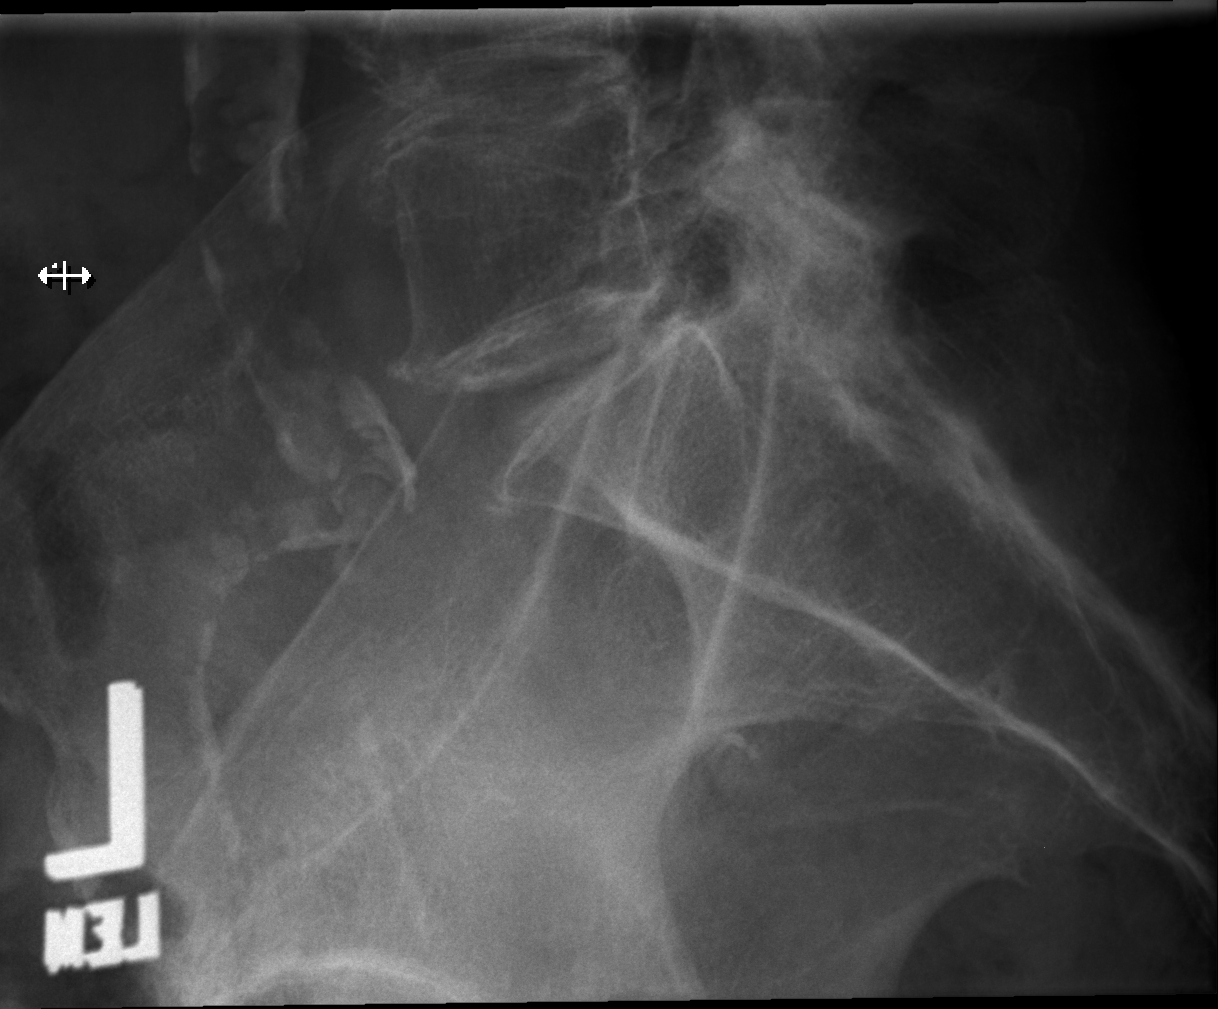

[5 of 5 positions shown; findings below may reference images not displayed]

FINDINGS: Five lumbar type vertebral bodies.

Normal lumbar lordosis.  Mild lumbar levoscoliosis.

No evidence of fracture or dislocation. Vertebral body heights are
maintained.

Mild multilevel degenerative changes.

Visualized bony pelvis appears intact.

Surgical clips in the right upper abdomen.
IMPRESSION: No fracture or dislocation is seen.

Mild degenerative changes with lumbar levoscoliosis.

## 2016-09-03 DIAGNOSIS — M79674 Pain in right toe(s): Secondary | ICD-10-CM | POA: Diagnosis not present

## 2016-09-03 DIAGNOSIS — L603 Nail dystrophy: Secondary | ICD-10-CM | POA: Diagnosis not present

## 2016-09-03 DIAGNOSIS — M79675 Pain in left toe(s): Secondary | ICD-10-CM | POA: Diagnosis not present

## 2016-10-03 DIAGNOSIS — R030 Elevated blood-pressure reading, without diagnosis of hypertension: Secondary | ICD-10-CM | POA: Diagnosis not present

## 2016-10-03 DIAGNOSIS — F0391 Unspecified dementia with behavioral disturbance: Secondary | ICD-10-CM | POA: Diagnosis not present

## 2016-10-03 DIAGNOSIS — Z720 Tobacco use: Secondary | ICD-10-CM | POA: Diagnosis not present

## 2016-10-03 DIAGNOSIS — R54 Age-related physical debility: Secondary | ICD-10-CM | POA: Diagnosis not present

## 2017-01-07 DIAGNOSIS — R296 Repeated falls: Secondary | ICD-10-CM | POA: Diagnosis not present

## 2017-01-07 DIAGNOSIS — F0391 Unspecified dementia with behavioral disturbance: Secondary | ICD-10-CM | POA: Diagnosis not present

## 2017-01-07 DIAGNOSIS — R2681 Unsteadiness on feet: Secondary | ICD-10-CM | POA: Diagnosis not present

## 2017-01-07 DIAGNOSIS — H10501 Unspecified blepharoconjunctivitis, right eye: Secondary | ICD-10-CM | POA: Diagnosis not present

## 2017-01-14 DIAGNOSIS — R296 Repeated falls: Secondary | ICD-10-CM | POA: Diagnosis not present

## 2017-01-14 DIAGNOSIS — F0391 Unspecified dementia with behavioral disturbance: Secondary | ICD-10-CM | POA: Diagnosis not present

## 2017-01-14 DIAGNOSIS — Z9181 History of falling: Secondary | ICD-10-CM | POA: Diagnosis not present

## 2017-01-14 DIAGNOSIS — Z7982 Long term (current) use of aspirin: Secondary | ICD-10-CM | POA: Diagnosis not present

## 2017-01-14 DIAGNOSIS — R2681 Unsteadiness on feet: Secondary | ICD-10-CM | POA: Diagnosis not present

## 2017-01-18 DIAGNOSIS — R296 Repeated falls: Secondary | ICD-10-CM | POA: Diagnosis not present

## 2017-01-18 DIAGNOSIS — Z9181 History of falling: Secondary | ICD-10-CM | POA: Diagnosis not present

## 2017-01-18 DIAGNOSIS — Z7982 Long term (current) use of aspirin: Secondary | ICD-10-CM | POA: Diagnosis not present

## 2017-01-18 DIAGNOSIS — R2681 Unsteadiness on feet: Secondary | ICD-10-CM | POA: Diagnosis not present

## 2017-01-18 DIAGNOSIS — F0391 Unspecified dementia with behavioral disturbance: Secondary | ICD-10-CM | POA: Diagnosis not present

## 2017-01-21 DIAGNOSIS — Z7982 Long term (current) use of aspirin: Secondary | ICD-10-CM | POA: Diagnosis not present

## 2017-01-21 DIAGNOSIS — F0391 Unspecified dementia with behavioral disturbance: Secondary | ICD-10-CM | POA: Diagnosis not present

## 2017-01-21 DIAGNOSIS — Z9181 History of falling: Secondary | ICD-10-CM | POA: Diagnosis not present

## 2017-01-21 DIAGNOSIS — R296 Repeated falls: Secondary | ICD-10-CM | POA: Diagnosis not present

## 2017-01-21 DIAGNOSIS — R2681 Unsteadiness on feet: Secondary | ICD-10-CM | POA: Diagnosis not present

## 2017-01-23 DIAGNOSIS — R2681 Unsteadiness on feet: Secondary | ICD-10-CM | POA: Diagnosis not present

## 2017-01-23 DIAGNOSIS — Z7982 Long term (current) use of aspirin: Secondary | ICD-10-CM | POA: Diagnosis not present

## 2017-01-23 DIAGNOSIS — F0391 Unspecified dementia with behavioral disturbance: Secondary | ICD-10-CM | POA: Diagnosis not present

## 2017-01-23 DIAGNOSIS — Z9181 History of falling: Secondary | ICD-10-CM | POA: Diagnosis not present

## 2017-01-23 DIAGNOSIS — R296 Repeated falls: Secondary | ICD-10-CM | POA: Diagnosis not present

## 2017-01-28 DIAGNOSIS — R296 Repeated falls: Secondary | ICD-10-CM | POA: Diagnosis not present

## 2017-01-28 DIAGNOSIS — Z9181 History of falling: Secondary | ICD-10-CM | POA: Diagnosis not present

## 2017-01-28 DIAGNOSIS — Z7982 Long term (current) use of aspirin: Secondary | ICD-10-CM | POA: Diagnosis not present

## 2017-01-28 DIAGNOSIS — R2681 Unsteadiness on feet: Secondary | ICD-10-CM | POA: Diagnosis not present

## 2017-01-28 DIAGNOSIS — F0391 Unspecified dementia with behavioral disturbance: Secondary | ICD-10-CM | POA: Diagnosis not present

## 2017-01-29 DIAGNOSIS — S6992XA Unspecified injury of left wrist, hand and finger(s), initial encounter: Secondary | ICD-10-CM | POA: Diagnosis not present

## 2017-01-29 DIAGNOSIS — S098XXA Other specified injuries of head, initial encounter: Secondary | ICD-10-CM | POA: Diagnosis not present

## 2017-01-29 DIAGNOSIS — R220 Localized swelling, mass and lump, head: Secondary | ICD-10-CM | POA: Diagnosis not present

## 2017-01-29 DIAGNOSIS — Z043 Encounter for examination and observation following other accident: Secondary | ICD-10-CM | POA: Diagnosis not present

## 2017-01-29 DIAGNOSIS — R229 Localized swelling, mass and lump, unspecified: Secondary | ICD-10-CM | POA: Diagnosis not present

## 2017-01-29 DIAGNOSIS — M25512 Pain in left shoulder: Secondary | ICD-10-CM | POA: Diagnosis not present

## 2017-01-29 DIAGNOSIS — M79602 Pain in left arm: Secondary | ICD-10-CM | POA: Diagnosis not present

## 2017-01-29 DIAGNOSIS — F4489 Other dissociative and conversion disorders: Secondary | ICD-10-CM | POA: Diagnosis not present

## 2017-01-29 DIAGNOSIS — S0510XA Contusion of eyeball and orbital tissues, unspecified eye, initial encounter: Secondary | ICD-10-CM | POA: Diagnosis not present

## 2017-01-29 DIAGNOSIS — S4992XA Unspecified injury of left shoulder and upper arm, initial encounter: Secondary | ICD-10-CM | POA: Diagnosis not present

## 2017-02-03 DIAGNOSIS — Z7982 Long term (current) use of aspirin: Secondary | ICD-10-CM | POA: Diagnosis not present

## 2017-02-03 DIAGNOSIS — F0391 Unspecified dementia with behavioral disturbance: Secondary | ICD-10-CM | POA: Diagnosis not present

## 2017-02-03 DIAGNOSIS — H10501 Unspecified blepharoconjunctivitis, right eye: Secondary | ICD-10-CM | POA: Diagnosis not present

## 2017-02-03 DIAGNOSIS — R296 Repeated falls: Secondary | ICD-10-CM | POA: Diagnosis not present

## 2017-02-03 DIAGNOSIS — F17210 Nicotine dependence, cigarettes, uncomplicated: Secondary | ICD-10-CM | POA: Diagnosis not present

## 2017-02-03 DIAGNOSIS — R269 Unspecified abnormalities of gait and mobility: Secondary | ICD-10-CM | POA: Diagnosis not present

## 2017-02-06 DIAGNOSIS — F17210 Nicotine dependence, cigarettes, uncomplicated: Secondary | ICD-10-CM | POA: Diagnosis not present

## 2017-02-06 DIAGNOSIS — R2681 Unsteadiness on feet: Secondary | ICD-10-CM | POA: Diagnosis not present

## 2017-02-06 DIAGNOSIS — Z9181 History of falling: Secondary | ICD-10-CM | POA: Diagnosis not present

## 2017-02-06 DIAGNOSIS — R269 Unspecified abnormalities of gait and mobility: Secondary | ICD-10-CM | POA: Diagnosis not present

## 2017-02-06 DIAGNOSIS — R296 Repeated falls: Secondary | ICD-10-CM | POA: Diagnosis not present

## 2017-02-06 DIAGNOSIS — H10501 Unspecified blepharoconjunctivitis, right eye: Secondary | ICD-10-CM | POA: Diagnosis not present

## 2017-02-06 DIAGNOSIS — Z7982 Long term (current) use of aspirin: Secondary | ICD-10-CM | POA: Diagnosis not present

## 2017-02-06 DIAGNOSIS — F0391 Unspecified dementia with behavioral disturbance: Secondary | ICD-10-CM | POA: Diagnosis not present

## 2017-02-08 DIAGNOSIS — H10501 Unspecified blepharoconjunctivitis, right eye: Secondary | ICD-10-CM | POA: Diagnosis not present

## 2017-02-08 DIAGNOSIS — F0391 Unspecified dementia with behavioral disturbance: Secondary | ICD-10-CM | POA: Diagnosis not present

## 2017-02-08 DIAGNOSIS — R269 Unspecified abnormalities of gait and mobility: Secondary | ICD-10-CM | POA: Diagnosis not present

## 2017-02-08 DIAGNOSIS — R296 Repeated falls: Secondary | ICD-10-CM | POA: Diagnosis not present

## 2017-02-08 DIAGNOSIS — Z7982 Long term (current) use of aspirin: Secondary | ICD-10-CM | POA: Diagnosis not present

## 2017-02-08 DIAGNOSIS — F17210 Nicotine dependence, cigarettes, uncomplicated: Secondary | ICD-10-CM | POA: Diagnosis not present

## 2017-02-11 DIAGNOSIS — H10501 Unspecified blepharoconjunctivitis, right eye: Secondary | ICD-10-CM | POA: Diagnosis not present

## 2017-02-11 DIAGNOSIS — F17210 Nicotine dependence, cigarettes, uncomplicated: Secondary | ICD-10-CM | POA: Diagnosis not present

## 2017-02-11 DIAGNOSIS — Z7982 Long term (current) use of aspirin: Secondary | ICD-10-CM | POA: Diagnosis not present

## 2017-02-11 DIAGNOSIS — R269 Unspecified abnormalities of gait and mobility: Secondary | ICD-10-CM | POA: Diagnosis not present

## 2017-02-11 DIAGNOSIS — F0391 Unspecified dementia with behavioral disturbance: Secondary | ICD-10-CM | POA: Diagnosis not present

## 2017-02-11 DIAGNOSIS — R296 Repeated falls: Secondary | ICD-10-CM | POA: Diagnosis not present

## 2017-02-13 DIAGNOSIS — R296 Repeated falls: Secondary | ICD-10-CM | POA: Diagnosis not present

## 2017-02-13 DIAGNOSIS — H10501 Unspecified blepharoconjunctivitis, right eye: Secondary | ICD-10-CM | POA: Diagnosis not present

## 2017-02-13 DIAGNOSIS — F17210 Nicotine dependence, cigarettes, uncomplicated: Secondary | ICD-10-CM | POA: Diagnosis not present

## 2017-02-13 DIAGNOSIS — F0391 Unspecified dementia with behavioral disturbance: Secondary | ICD-10-CM | POA: Diagnosis not present

## 2017-02-13 DIAGNOSIS — Z7982 Long term (current) use of aspirin: Secondary | ICD-10-CM | POA: Diagnosis not present

## 2017-02-13 DIAGNOSIS — R269 Unspecified abnormalities of gait and mobility: Secondary | ICD-10-CM | POA: Diagnosis not present

## 2017-02-20 DIAGNOSIS — R269 Unspecified abnormalities of gait and mobility: Secondary | ICD-10-CM | POA: Diagnosis not present

## 2017-02-20 DIAGNOSIS — F17210 Nicotine dependence, cigarettes, uncomplicated: Secondary | ICD-10-CM | POA: Diagnosis not present

## 2017-02-20 DIAGNOSIS — R296 Repeated falls: Secondary | ICD-10-CM | POA: Diagnosis not present

## 2017-02-20 DIAGNOSIS — F0391 Unspecified dementia with behavioral disturbance: Secondary | ICD-10-CM | POA: Diagnosis not present

## 2017-02-20 DIAGNOSIS — Z7982 Long term (current) use of aspirin: Secondary | ICD-10-CM | POA: Diagnosis not present

## 2017-02-20 DIAGNOSIS — H10501 Unspecified blepharoconjunctivitis, right eye: Secondary | ICD-10-CM | POA: Diagnosis not present

## 2017-02-22 DIAGNOSIS — H10501 Unspecified blepharoconjunctivitis, right eye: Secondary | ICD-10-CM | POA: Diagnosis not present

## 2017-02-22 DIAGNOSIS — R296 Repeated falls: Secondary | ICD-10-CM | POA: Diagnosis not present

## 2017-02-22 DIAGNOSIS — F17210 Nicotine dependence, cigarettes, uncomplicated: Secondary | ICD-10-CM | POA: Diagnosis not present

## 2017-02-22 DIAGNOSIS — F0391 Unspecified dementia with behavioral disturbance: Secondary | ICD-10-CM | POA: Diagnosis not present

## 2017-02-22 DIAGNOSIS — R269 Unspecified abnormalities of gait and mobility: Secondary | ICD-10-CM | POA: Diagnosis not present

## 2017-02-22 DIAGNOSIS — Z7982 Long term (current) use of aspirin: Secondary | ICD-10-CM | POA: Diagnosis not present

## 2017-02-25 DIAGNOSIS — F17210 Nicotine dependence, cigarettes, uncomplicated: Secondary | ICD-10-CM | POA: Diagnosis not present

## 2017-02-25 DIAGNOSIS — R269 Unspecified abnormalities of gait and mobility: Secondary | ICD-10-CM | POA: Diagnosis not present

## 2017-02-25 DIAGNOSIS — Z7982 Long term (current) use of aspirin: Secondary | ICD-10-CM | POA: Diagnosis not present

## 2017-02-25 DIAGNOSIS — H10501 Unspecified blepharoconjunctivitis, right eye: Secondary | ICD-10-CM | POA: Diagnosis not present

## 2017-02-25 DIAGNOSIS — F0391 Unspecified dementia with behavioral disturbance: Secondary | ICD-10-CM | POA: Diagnosis not present

## 2017-02-25 DIAGNOSIS — R296 Repeated falls: Secondary | ICD-10-CM | POA: Diagnosis not present

## 2017-02-27 DIAGNOSIS — H10501 Unspecified blepharoconjunctivitis, right eye: Secondary | ICD-10-CM | POA: Diagnosis not present

## 2017-02-27 DIAGNOSIS — F17210 Nicotine dependence, cigarettes, uncomplicated: Secondary | ICD-10-CM | POA: Diagnosis not present

## 2017-02-27 DIAGNOSIS — Z7982 Long term (current) use of aspirin: Secondary | ICD-10-CM | POA: Diagnosis not present

## 2017-02-27 DIAGNOSIS — F0391 Unspecified dementia with behavioral disturbance: Secondary | ICD-10-CM | POA: Diagnosis not present

## 2017-02-27 DIAGNOSIS — R269 Unspecified abnormalities of gait and mobility: Secondary | ICD-10-CM | POA: Diagnosis not present

## 2017-02-27 DIAGNOSIS — R296 Repeated falls: Secondary | ICD-10-CM | POA: Diagnosis not present

## 2017-03-13 DIAGNOSIS — R296 Repeated falls: Secondary | ICD-10-CM | POA: Diagnosis not present

## 2017-03-13 DIAGNOSIS — R269 Unspecified abnormalities of gait and mobility: Secondary | ICD-10-CM | POA: Diagnosis not present

## 2017-03-13 DIAGNOSIS — F0391 Unspecified dementia with behavioral disturbance: Secondary | ICD-10-CM | POA: Diagnosis not present

## 2017-03-13 DIAGNOSIS — H10501 Unspecified blepharoconjunctivitis, right eye: Secondary | ICD-10-CM | POA: Diagnosis not present

## 2017-03-13 DIAGNOSIS — Z7982 Long term (current) use of aspirin: Secondary | ICD-10-CM | POA: Diagnosis not present

## 2017-03-13 DIAGNOSIS — F17210 Nicotine dependence, cigarettes, uncomplicated: Secondary | ICD-10-CM | POA: Diagnosis not present

## 2017-03-27 DIAGNOSIS — F0391 Unspecified dementia with behavioral disturbance: Secondary | ICD-10-CM | POA: Diagnosis not present

## 2017-03-27 DIAGNOSIS — R32 Unspecified urinary incontinence: Secondary | ICD-10-CM | POA: Diagnosis not present

## 2017-03-27 DIAGNOSIS — R2681 Unsteadiness on feet: Secondary | ICD-10-CM | POA: Diagnosis not present

## 2017-03-27 DIAGNOSIS — Z720 Tobacco use: Secondary | ICD-10-CM | POA: Diagnosis not present

## 2017-05-03 DIAGNOSIS — M79622 Pain in left upper arm: Secondary | ICD-10-CM | POA: Diagnosis not present

## 2017-05-03 DIAGNOSIS — M25552 Pain in left hip: Secondary | ICD-10-CM | POA: Diagnosis not present

## 2017-05-03 DIAGNOSIS — T148XXA Other injury of unspecified body region, initial encounter: Secondary | ICD-10-CM | POA: Diagnosis not present

## 2017-05-03 DIAGNOSIS — S59912A Unspecified injury of left forearm, initial encounter: Secondary | ICD-10-CM | POA: Diagnosis not present

## 2017-05-03 DIAGNOSIS — M25512 Pain in left shoulder: Secondary | ICD-10-CM | POA: Diagnosis not present

## 2017-05-03 DIAGNOSIS — S79912A Unspecified injury of left hip, initial encounter: Secondary | ICD-10-CM | POA: Diagnosis not present

## 2017-05-03 DIAGNOSIS — S4992XA Unspecified injury of left shoulder and upper arm, initial encounter: Secondary | ICD-10-CM | POA: Diagnosis not present

## 2017-05-17 DIAGNOSIS — H524 Presbyopia: Secondary | ICD-10-CM | POA: Diagnosis not present

## 2017-05-17 DIAGNOSIS — H35371 Puckering of macula, right eye: Secondary | ICD-10-CM | POA: Diagnosis not present

## 2017-05-17 DIAGNOSIS — Z9842 Cataract extraction status, left eye: Secondary | ICD-10-CM | POA: Diagnosis not present

## 2017-05-17 DIAGNOSIS — Z9841 Cataract extraction status, right eye: Secondary | ICD-10-CM | POA: Diagnosis not present

## 2017-05-17 DIAGNOSIS — Z961 Presence of intraocular lens: Secondary | ICD-10-CM | POA: Diagnosis not present

## 2017-05-17 DIAGNOSIS — H52223 Regular astigmatism, bilateral: Secondary | ICD-10-CM | POA: Diagnosis not present

## 2017-05-17 DIAGNOSIS — H59031 Cystoid macular edema following cataract surgery, right eye: Secondary | ICD-10-CM | POA: Diagnosis not present

## 2017-05-17 DIAGNOSIS — H5203 Hypermetropia, bilateral: Secondary | ICD-10-CM | POA: Diagnosis not present

## 2017-05-17 DIAGNOSIS — H353131 Nonexudative age-related macular degeneration, bilateral, early dry stage: Secondary | ICD-10-CM | POA: Diagnosis not present

## 2017-07-18 DIAGNOSIS — Z23 Encounter for immunization: Secondary | ICD-10-CM | POA: Diagnosis not present

## 2017-07-29 DIAGNOSIS — L400 Psoriasis vulgaris: Secondary | ICD-10-CM | POA: Diagnosis not present

## 2017-08-12 DIAGNOSIS — S93401D Sprain of unspecified ligament of right ankle, subsequent encounter: Secondary | ICD-10-CM | POA: Diagnosis not present

## 2017-08-12 DIAGNOSIS — S8992XA Unspecified injury of left lower leg, initial encounter: Secondary | ICD-10-CM | POA: Diagnosis not present

## 2019-05-09 DEATH — deceased
